# Patient Record
Sex: Male | Born: 2000 | Hispanic: Yes | Marital: Single | Smoking: Never smoker
Health system: Southern US, Community
[De-identification: ages and names within clinical notes are randomized; demographics above are authoritative.]

## PROBLEM LIST (undated history)

## (undated) DIAGNOSIS — L309 Dermatitis, unspecified: Secondary | ICD-10-CM

## (undated) DIAGNOSIS — G4730 Sleep apnea, unspecified: Secondary | ICD-10-CM

## (undated) DIAGNOSIS — J353 Hypertrophy of tonsils with hypertrophy of adenoids: Secondary | ICD-10-CM

## (undated) DIAGNOSIS — J45909 Unspecified asthma, uncomplicated: Secondary | ICD-10-CM

## (undated) HISTORY — DX: Unspecified asthma, uncomplicated: J45.909

## (undated) HISTORY — DX: Dermatitis, unspecified: L30.9

---
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
## 2020-09-29 DIAGNOSIS — J301 Allergic rhinitis due to pollen: Secondary | ICD-10-CM | POA: Diagnosis not present

## 2020-10-06 DIAGNOSIS — J301 Allergic rhinitis due to pollen: Secondary | ICD-10-CM | POA: Diagnosis not present

## 2020-10-20 DIAGNOSIS — J301 Allergic rhinitis due to pollen: Secondary | ICD-10-CM | POA: Diagnosis not present

## 2020-10-20 DIAGNOSIS — H109 Unspecified conjunctivitis: Secondary | ICD-10-CM | POA: Diagnosis not present

## 2020-10-21 DIAGNOSIS — J301 Allergic rhinitis due to pollen: Secondary | ICD-10-CM | POA: Diagnosis not present

## 2020-11-03 DIAGNOSIS — J301 Allergic rhinitis due to pollen: Secondary | ICD-10-CM | POA: Diagnosis not present

## 2020-12-08 DIAGNOSIS — J301 Allergic rhinitis due to pollen: Secondary | ICD-10-CM | POA: Diagnosis not present

## 2020-12-29 DIAGNOSIS — J301 Allergic rhinitis due to pollen: Secondary | ICD-10-CM | POA: Diagnosis not present

## 2020-12-30 DIAGNOSIS — H109 Unspecified conjunctivitis: Secondary | ICD-10-CM | POA: Diagnosis not present

## 2020-12-30 DIAGNOSIS — L0100 Impetigo, unspecified: Secondary | ICD-10-CM | POA: Diagnosis not present

## 2021-01-05 DIAGNOSIS — J301 Allergic rhinitis due to pollen: Secondary | ICD-10-CM | POA: Diagnosis not present

## 2021-01-11 DIAGNOSIS — J301 Allergic rhinitis due to pollen: Secondary | ICD-10-CM | POA: Diagnosis not present

## 2021-01-19 DIAGNOSIS — J301 Allergic rhinitis due to pollen: Secondary | ICD-10-CM | POA: Diagnosis not present

## 2021-01-26 DIAGNOSIS — J301 Allergic rhinitis due to pollen: Secondary | ICD-10-CM | POA: Diagnosis not present

## 2021-02-09 DIAGNOSIS — J301 Allergic rhinitis due to pollen: Secondary | ICD-10-CM | POA: Diagnosis not present

## 2021-02-16 DIAGNOSIS — J301 Allergic rhinitis due to pollen: Secondary | ICD-10-CM | POA: Diagnosis not present

## 2021-03-02 DIAGNOSIS — J301 Allergic rhinitis due to pollen: Secondary | ICD-10-CM | POA: Diagnosis not present

## 2021-03-09 DIAGNOSIS — J301 Allergic rhinitis due to pollen: Secondary | ICD-10-CM | POA: Diagnosis not present

## 2021-03-30 DIAGNOSIS — J301 Allergic rhinitis due to pollen: Secondary | ICD-10-CM | POA: Diagnosis not present

## 2021-03-31 DIAGNOSIS — J301 Allergic rhinitis due to pollen: Secondary | ICD-10-CM | POA: Diagnosis not present

## 2021-04-06 DIAGNOSIS — J301 Allergic rhinitis due to pollen: Secondary | ICD-10-CM | POA: Diagnosis not present

## 2021-04-20 DIAGNOSIS — J301 Allergic rhinitis due to pollen: Secondary | ICD-10-CM | POA: Diagnosis not present

## 2021-04-27 DIAGNOSIS — J301 Allergic rhinitis due to pollen: Secondary | ICD-10-CM | POA: Diagnosis not present

## 2021-05-11 DIAGNOSIS — J301 Allergic rhinitis due to pollen: Secondary | ICD-10-CM | POA: Diagnosis not present

## 2021-05-15 DIAGNOSIS — H1033 Unspecified acute conjunctivitis, bilateral: Secondary | ICD-10-CM | POA: Diagnosis not present

## 2021-05-15 DIAGNOSIS — L2089 Other atopic dermatitis: Secondary | ICD-10-CM | POA: Diagnosis not present

## 2021-05-18 DIAGNOSIS — J301 Allergic rhinitis due to pollen: Secondary | ICD-10-CM | POA: Diagnosis not present

## 2021-05-25 DIAGNOSIS — J301 Allergic rhinitis due to pollen: Secondary | ICD-10-CM | POA: Diagnosis not present

## 2021-06-15 DIAGNOSIS — J301 Allergic rhinitis due to pollen: Secondary | ICD-10-CM | POA: Diagnosis not present

## 2021-06-22 DIAGNOSIS — J301 Allergic rhinitis due to pollen: Secondary | ICD-10-CM | POA: Diagnosis not present

## 2021-06-23 DIAGNOSIS — J301 Allergic rhinitis due to pollen: Secondary | ICD-10-CM | POA: Diagnosis not present

## 2021-06-29 DIAGNOSIS — J301 Allergic rhinitis due to pollen: Secondary | ICD-10-CM | POA: Diagnosis not present

## 2021-06-30 DIAGNOSIS — Z20822 Contact with and (suspected) exposure to covid-19: Secondary | ICD-10-CM | POA: Diagnosis not present

## 2021-06-30 DIAGNOSIS — Z03818 Encounter for observation for suspected exposure to other biological agents ruled out: Secondary | ICD-10-CM | POA: Diagnosis not present

## 2021-07-06 DIAGNOSIS — J301 Allergic rhinitis due to pollen: Secondary | ICD-10-CM | POA: Diagnosis not present

## 2021-07-27 DIAGNOSIS — J301 Allergic rhinitis due to pollen: Secondary | ICD-10-CM | POA: Diagnosis not present

## 2021-08-17 DIAGNOSIS — J301 Allergic rhinitis due to pollen: Secondary | ICD-10-CM | POA: Diagnosis not present

## 2021-09-11 DIAGNOSIS — S76312A Strain of muscle, fascia and tendon of the posterior muscle group at thigh level, left thigh, initial encounter: Secondary | ICD-10-CM | POA: Diagnosis not present

## 2021-09-14 DIAGNOSIS — J301 Allergic rhinitis due to pollen: Secondary | ICD-10-CM | POA: Diagnosis not present

## 2021-09-15 DIAGNOSIS — J301 Allergic rhinitis due to pollen: Secondary | ICD-10-CM | POA: Diagnosis not present

## 2021-09-28 DIAGNOSIS — J301 Allergic rhinitis due to pollen: Secondary | ICD-10-CM | POA: Diagnosis not present

## 2021-10-26 DIAGNOSIS — J301 Allergic rhinitis due to pollen: Secondary | ICD-10-CM | POA: Diagnosis not present

## 2021-11-09 DIAGNOSIS — J301 Allergic rhinitis due to pollen: Secondary | ICD-10-CM | POA: Diagnosis not present

## 2021-11-24 DIAGNOSIS — J101 Influenza due to other identified influenza virus with other respiratory manifestations: Secondary | ICD-10-CM | POA: Diagnosis not present

## 2021-11-24 DIAGNOSIS — J029 Acute pharyngitis, unspecified: Secondary | ICD-10-CM | POA: Diagnosis not present

## 2021-11-24 DIAGNOSIS — J209 Acute bronchitis, unspecified: Secondary | ICD-10-CM | POA: Diagnosis not present

## 2021-11-24 DIAGNOSIS — J019 Acute sinusitis, unspecified: Secondary | ICD-10-CM | POA: Diagnosis not present

## 2021-11-24 DIAGNOSIS — H109 Unspecified conjunctivitis: Secondary | ICD-10-CM | POA: Diagnosis not present

## 2021-11-24 DIAGNOSIS — Z03818 Encounter for observation for suspected exposure to other biological agents ruled out: Secondary | ICD-10-CM | POA: Diagnosis not present

## 2021-12-05 DIAGNOSIS — J301 Allergic rhinitis due to pollen: Secondary | ICD-10-CM | POA: Diagnosis not present

## 2021-12-07 DIAGNOSIS — J301 Allergic rhinitis due to pollen: Secondary | ICD-10-CM | POA: Diagnosis not present

## 2021-12-21 DIAGNOSIS — J301 Allergic rhinitis due to pollen: Secondary | ICD-10-CM | POA: Diagnosis not present

## 2022-01-18 DIAGNOSIS — J301 Allergic rhinitis due to pollen: Secondary | ICD-10-CM | POA: Diagnosis not present

## 2022-01-30 DIAGNOSIS — J45901 Unspecified asthma with (acute) exacerbation: Secondary | ICD-10-CM | POA: Diagnosis not present

## 2022-02-15 DIAGNOSIS — J301 Allergic rhinitis due to pollen: Secondary | ICD-10-CM | POA: Diagnosis not present

## 2022-02-21 ENCOUNTER — Ambulatory Visit: Payer: Self-pay | Admitting: Internal Medicine

## 2022-03-01 ENCOUNTER — Encounter: Payer: Self-pay | Admitting: Internal Medicine

## 2022-03-01 ENCOUNTER — Ambulatory Visit (INDEPENDENT_AMBULATORY_CARE_PROVIDER_SITE_OTHER): Payer: BLUE CROSS/BLUE SHIELD | Admitting: Internal Medicine

## 2022-03-01 ENCOUNTER — Other Ambulatory Visit: Payer: Self-pay

## 2022-03-01 VITALS — BP 125/74 | HR 87 | Temp 97.9°F | Ht 71.85 in | Wt 251.0 lb

## 2022-03-01 DIAGNOSIS — M5442 Lumbago with sciatica, left side: Secondary | ICD-10-CM | POA: Diagnosis not present

## 2022-03-01 DIAGNOSIS — J4520 Mild intermittent asthma, uncomplicated: Secondary | ICD-10-CM | POA: Diagnosis not present

## 2022-03-01 DIAGNOSIS — J301 Allergic rhinitis due to pollen: Secondary | ICD-10-CM | POA: Diagnosis not present

## 2022-03-01 LAB — URINALYSIS, ROUTINE W REFLEX MICROSCOPIC
Bilirubin, UA: NEGATIVE
Glucose, UA: NEGATIVE
Ketones, UA: NEGATIVE
Leukocytes,UA: NEGATIVE
Nitrite, UA: NEGATIVE
Protein,UA: NEGATIVE
RBC, UA: NEGATIVE
Specific Gravity, UA: 1.02 (ref 1.005–1.030)
Urobilinogen, Ur: 0.2 mg/dL (ref 0.2–1.0)
pH, UA: 5.5 (ref 5.0–7.5)

## 2022-03-01 MED ORDER — FLUTICASONE-SALMETEROL 100-50 MCG/ACT IN AEPB: 1.0000 | INHALATION_SPRAY | Freq: Two times a day (BID) | RESPIRATORY_TRACT | 3 refills | Status: DC

## 2022-03-01 NOTE — Progress Notes (Signed)
? ?BP 125/74   Pulse 87   Temp 97.9 ?F (36.6 ?C) (Oral)   Ht 5' 11.85" (1.825 m)   Wt 251 lb (113.9 kg)   SpO2 99%   BMI 34.18 kg/m?   ? ?Subjective:  ? ? Patient ID: Curtis Meyer, male    DOB: 2001-03-15, 21 y.o.   MRN: [id number] ? ?Chief Complaint  ?Patient presents with  ?? New Patient (Initial Visit)  ?  Left Leg pain   ? ? ?HPI: ?Curtis Meyer is a 21 y.o. male ? ?Pt is here to establish care ?He says he feels like he pinched a nerve in the lower back at work. Has some pain on the left side that radiates down to the LLE ?Used to lift boxes for [employer], worse when he plays soccer too  ? ? ?Back Pain ?This is a chronic problem. The current episode started 1 to 4 weeks ago. The problem occurs intermittently. The pain is present in the lumbar spine. The quality of the pain is described as shooting. The pain radiates to the left foot. The pain is at a severity of 8/10. The pain is moderate. Associated symptoms include numbness and paresthesias. Pertinent negatives include no chest pain, fever, headaches, leg pain, pelvic pain, perianal numbness, tingling, weakness or weight loss.  ?Asthma ?He complains of cough and wheezing. There is no chest tightness, difficulty breathing, frequent throat clearing, hemoptysis, hoarse voice, shortness of breath or sputum production. Primary symptoms comments: Seasonal and when he is sick  ?Marland Kitchen Pertinent negatives include no chest pain, dyspnea on exertion, ear congestion, fever, headaches, heartburn, malaise/fatigue, myalgias, nasal congestion, orthopnea, PND, postnasal drip, rhinorrhea, sneezing, sore throat, sweats, trouble swallowing or weight loss. His symptoms are aggravated by pollen, URI, change in weather and strenuous activity. His past medical history is significant for asthma.  ? ?Chief Complaint  ?Patient presents with  ?? New Patient (Initial Visit)  ?  Left Leg pain   ? ? ?Relevant past medical, surgical, family and social history reviewed and updated as  indicated. Interim medical history since our last visit reviewed. ?Allergies and medications reviewed and updated. ? ?Review of Systems  ?Constitutional:  Negative for fever, malaise/fatigue and weight loss.  ?HENT:  Negative for hoarse voice, postnasal drip, rhinorrhea, sneezing, sore throat and trouble swallowing.   ?Respiratory:  Positive for cough and wheezing. Negative for hemoptysis, sputum production and shortness of breath.   ?Cardiovascular:  Negative for chest pain, dyspnea on exertion and PND.  ?Gastrointestinal:  Negative for heartburn.  ?Genitourinary:  Negative for pelvic pain.  ?Musculoskeletal:  Positive for back pain. Negative for myalgias.  ?Neurological:  Positive for numbness and paresthesias. Negative for tingling, weakness and headaches.  ? ?Per HPI unless specifically indicated above ? ?   ?Objective:  ?  ?BP 125/74   Pulse 87   Temp 97.9 ?F (36.6 ?C) (Oral)   Ht 5' 11.85" (1.825 m)   Wt 251 lb (113.9 kg)   SpO2 99%   BMI 34.18 kg/m?   ?Wt Readings from Last 3 Encounters:  ?03/01/22 251 lb (113.9 kg)  ?11/16/15 202 lb (91.6 kg) (>99 %, Z= 2.43)*  ? ?* Growth percentiles are based on CDC (Boys, 2-20 Years) data.  ?  ?Physical Exam ?Vitals and nursing note reviewed.  ?Constitutional:   ?   General: He is not in acute distress. ?   Appearance: Normal appearance. He is not ill-appearing or diaphoretic.  ?HENT:  ?   Head: Normocephalic  and atraumatic.  ?   Right Ear: Tympanic membrane and external ear normal. There is no impacted cerumen.  ?   Left Ear: External ear normal.  ?   Nose: No congestion or rhinorrhea.  ?   Mouth/Throat:  ?   Pharynx: No oropharyngeal exudate or posterior oropharyngeal erythema.  ?Eyes:  ?   Conjunctiva/sclera: Conjunctivae normal.  ?   Pupils: Pupils are equal, round, and reactive to light.  ?Cardiovascular:  ?   Rate and Rhythm: Normal rate and regular rhythm.  ?   Heart sounds: No murmur heard. ?  No friction rub. No gallop.  ?Pulmonary:  ?   Effort: No  respiratory distress.  ?   Breath sounds: No stridor. No wheezing or rhonchi.  ?Chest:  ?   Chest wall: No tenderness.  ?Abdominal:  ?   General: Abdomen is flat. Bowel sounds are normal.  ?   Palpations: Abdomen is soft. There is no mass.  ?   Tenderness: There is no abdominal tenderness.  ?Musculoskeletal:     ?   General: Tenderness present. No swelling, deformity or signs of injury.  ?   Cervical back: Normal range of motion and neck supple. No rigidity or tenderness.  ?   Right lower leg: No edema.  ?   Left lower leg: No edema.  ?Skin: ?   General: Skin is warm and dry.  ?Neurological:  ?   Mental Status: He is alert.  ?Psychiatric:     ?   Mood and Affect: Mood normal.     ?   Behavior: Behavior normal.  ? ? ?Results for orders placed or performed during the hospital encounter of 11/16/15  ?Surgical pathology  ?Result Value Ref Range  ? SURGICAL PATHOLOGY    ?  Surgical Pathology ?CASE: [id number] ?PATIENT: Curtis Meyer ?Surgical Pathology Report ? ? ? ? ?SPECIMEN SUBMITTED: ?A. Tonsil, left ?B. Tonsil, right ? ?CLINICAL HISTORY: ?None provided ? ?PRE-OPERATIVE DIAGNOSIS: ?T and A hypertrophy snoring SOB ? ?POST-OPERATIVE DIAGNOSIS: ?Same as pre-op ? ? ? ? ?DIAGNOSIS: ?A. TONSIL, LEFT; TONSILLECTOMY: ?- REACTIVE LYMPHOID HYPERPLASIA WITH ACUTE CRYPTITIS. ?- NEGATIVE FOR MALIGNANCY. ? ?B. TONSIL, RIGHT; TONSILLECTOMY: ?- REACTIVE LYMPHOID HYPERPLASIA WITH ACUTE CRYPTITIS. ?- NEGATIVE FOR MALIGNANCY. ? ? ?GROSS DESCRIPTION: ?A. Labeled: Left tonsil ? ?Size: 4.2 x 2 x 1.2 cm ? ?Epithelial surface: Pink red and cryptic ? ?Cut surface: Unremarkable ? ?Other findings: None ? ?Block summary: ?1- representative section(s) ? ?B. Labeled: Right tonsil ? ?Size: 4.2 x 2 x 1.2 cm ? ?Epithelial surface: Pink red and soft ? ?Cut surface: Unremarkable ? ?Other findings: None ? ?Block summary: ?1- representative section(s) ? ? ? ? ?Final Diagnosis perform ed by Quay Burow, MD.  Electronically signed ?11/21/2015  9:52:21AM ? ? ? ?The electronic signature indicates that the named Attending Pathologist ?has evaluated the specimen ? ?Technical component performed at [facility], [address] [place], ?[place] [address] ?Lab: [id number] Dir: Darrick Penna. Evette Doffing, MD ? ?Professional component performed at [facility], [facility], ?[place], [place], [place] [address] ?Lab: [phone] Dir: Dellia Nims. Reuel Derby, MD ? ?  ? ?   ? ? ?Current Outpatient Medications:  ??  albuterol (PROVENTIL HFA;VENTOLIN HFA) 108 (90 BASE) MCG/ACT inhaler, Inhale 2 puffs into the lungs every 6 (six) hours as needed for wheezing or shortness of breath., Disp: 1 Inhaler, Rfl: 2 ??  cetirizine (ZYRTEC) 5 MG tablet, Take 5 mg by mouth daily., Disp: , Rfl:  ??  fluticasone (  FLONASE) 50 MCG/ACT nasal spray, Place into both nostrils daily., Disp: , Rfl:   ? ? ?Assessment & Plan:  ?Back pain  ?Check xray of the l spine.  ?most likely musckulskelteal though ?adviced streches for back ?Patient advised to take medication as directed here. Patient advised to rest initially and then slowly increase activity level. Monitor changes in symptoms such as numbness, tingling or weakness in legs, changes in bowel or bladder habits or worsening back pain. Proper ergonomics discussed. Referral to physical therapy as needed. Patient will call if symptoms worsen or if pain persists greater than 8 weeks. ? ? ?Asthma  ?Stable. Is on advair for such  ?Continue albuterol for such  ? ?Problem List Items Addressed This Visit   ?None ?  ? ?No orders of the defined types were placed in this encounter. ?  ? ?No orders of the defined types were placed in this encounter. ?  ? ?Follow up plan: ?No follow-ups on file. ? ? ?

## 2022-03-02 LAB — COMPREHENSIVE METABOLIC PANEL
ALT: 48 IU/L — ABNORMAL HIGH (ref 0–44)
AST: 32 IU/L (ref 0–40)
Albumin/Globulin Ratio: 1.7 (ref 1.2–2.2)
Albumin: 5.1 g/dL (ref 4.1–5.2)
Alkaline Phosphatase: 118 IU/L (ref 51–125)
BUN/Creatinine Ratio: 10 (ref 9–20)
BUN: 8 mg/dL (ref 6–20)
Bilirubin Total: 0.7 mg/dL (ref 0.0–1.2)
CO2: 19 mmol/L — ABNORMAL LOW (ref 20–29)
Calcium: 10.2 mg/dL (ref 8.7–10.2)
Chloride: 102 mmol/L (ref 96–106)
Creatinine, Ser: 0.83 mg/dL (ref 0.76–1.27)
Globulin, Total: 3 g/dL (ref 1.5–4.5)
Glucose: 86 mg/dL (ref 70–99)
Potassium: 4 mmol/L (ref 3.5–5.2)
Sodium: 139 mmol/L (ref 134–144)
Total Protein: 8.1 g/dL (ref 6.0–8.5)
eGFR: 128 mL/min/{1.73_m2} (ref >59)

## 2022-03-02 LAB — CBC WITH DIFFERENTIAL/PLATELET
Basophils Absolute: 0.1 10*3/uL (ref 0.0–0.2)
Basos: 1 %
EOS (ABSOLUTE): 0.5 10*3/uL — ABNORMAL HIGH (ref 0.0–0.4)
Eos: 7 %
Hematocrit: 47.6 % (ref 37.5–51.0)
Hemoglobin: 16.1 g/dL (ref 13.0–17.7)
Immature Grans (Abs): 0 10*3/uL (ref 0.0–0.1)
Immature Granulocytes: 1 %
Lymphocytes Absolute: 2.2 10*3/uL (ref 0.7–3.1)
Lymphs: 34 %
MCH: 27.5 pg (ref 26.6–33.0)
MCHC: 33.8 g/dL (ref 31.5–35.7)
MCV: 81 fL (ref 79–97)
Monocytes Absolute: 0.5 10*3/uL (ref 0.1–0.9)
Monocytes: 7 %
Neutrophils Absolute: 3.4 10*3/uL (ref 1.4–7.0)
Neutrophils: 50 %
Platelets: 189 10*3/uL (ref 150–450)
RBC: 5.86 x10E6/uL — ABNORMAL HIGH (ref 4.14–5.80)
RDW: 14 % (ref 11.6–15.4)
WBC: 6.6 10*3/uL (ref 3.4–10.8)

## 2022-03-02 LAB — TSH: TSH: 1.61 u[IU]/mL (ref 0.450–4.500)

## 2022-03-15 ENCOUNTER — Ambulatory Visit: Payer: BC Managed Care – PPO | Admitting: Internal Medicine

## 2022-03-28 ENCOUNTER — Ambulatory Visit: Payer: BC Managed Care – PPO | Admitting: Internal Medicine

## 2022-03-29 ENCOUNTER — Ambulatory Visit: Payer: BC Managed Care – PPO | Admitting: Internal Medicine

## 2022-04-05 DIAGNOSIS — J301 Allergic rhinitis due to pollen: Secondary | ICD-10-CM | POA: Diagnosis not present

## 2022-04-11 ENCOUNTER — Encounter: Payer: Self-pay | Admitting: Internal Medicine

## 2022-04-11 ENCOUNTER — Ambulatory Visit (INDEPENDENT_AMBULATORY_CARE_PROVIDER_SITE_OTHER): Payer: BLUE CROSS/BLUE SHIELD | Admitting: Internal Medicine

## 2022-04-11 VITALS — BP 124/76 | HR 83 | Temp 97.6°F | Ht 71.85 in | Wt 245.0 lb

## 2022-04-11 DIAGNOSIS — M545 Low back pain, unspecified: Secondary | ICD-10-CM | POA: Diagnosis not present

## 2022-04-11 DIAGNOSIS — G8929 Other chronic pain: Secondary | ICD-10-CM | POA: Insufficient documentation

## 2022-04-11 DIAGNOSIS — M79605 Pain in left leg: Secondary | ICD-10-CM | POA: Insufficient documentation

## 2022-04-11 DIAGNOSIS — S76302A Unspecified injury of muscle, fascia and tendon of the posterior muscle group at thigh level, left thigh, initial encounter: Secondary | ICD-10-CM | POA: Diagnosis not present

## 2022-04-11 MED ORDER — DICLOFENAC SODIUM 1 % EX GEL: 2.0000 g | Freq: Four times a day (QID) | CUTANEOUS | 1 refills | Status: DC

## 2022-04-11 NOTE — Progress Notes (Signed)
? ?BP 124/76   Pulse 83   Temp 97.6 ?F (36.4 ?C) (Oral)   Ht 5' 11.85" (1.825 m)   Wt 245 lb (111.1 kg)   SpO2 98%   BMI 33.37 kg/m?   ? ?Subjective:  ? ? Patient ID: Curtis Meyer, male    DOB: March 25, 2001, 21 y.o.   MRN: [id number] ? ?Chief Complaint  ?Patient presents with  ?? Back Pain  ?  Back pain has not changed, patient did not get xray  ? ? ?HPI: ?Curtis Meyer is a 21 y.o. male ? ?Back Pain ?Chronicity: has had back injury in the past from lifting a box at work x 2 yrs ago. pain is in the hamstrings.  ? ?Chief Complaint  ?Patient presents with  ?? Back Pain  ?  Back pain has not changed, patient did not get xray  ? ? ?Relevant past medical, surgical, family and social history reviewed and updated as indicated. Interim medical history since our last visit reviewed. ?Allergies and medications reviewed and updated. ? ?Review of Systems  ?Musculoskeletal:  Positive for back pain.  ? ?Per HPI unless specifically indicated above ? ?   ?Objective:  ?  ?BP 124/76   Pulse 83   Temp 97.6 ?F (36.4 ?C) (Oral)   Ht 5' 11.85" (1.825 m)   Wt 245 lb (111.1 kg)   SpO2 98%   BMI 33.37 kg/m?   ?Wt Readings from Last 3 Encounters:  ?04/11/22 245 lb (111.1 kg)  ?03/01/22 251 lb (113.9 kg)  ?11/16/15 202 lb (91.6 kg) (>99 %, Z= 2.43)*  ? ?* Growth percentiles are based on CDC (Boys, 2-20 Years) data.  ?  ?Physical Exam ?Vitals and nursing note reviewed.  ?Constitutional:   ?   General: He is not in acute distress. ?   Appearance: Normal appearance. He is not ill-appearing or diaphoretic.  ?HENT:  ?   Head: Normocephalic and atraumatic.  ?   Right Ear: Tympanic membrane and external ear normal. There is no impacted cerumen.  ?   Left Ear: External ear normal.  ?   Nose: No congestion or rhinorrhea.  ?   Mouth/Throat:  ?   Pharynx: No oropharyngeal exudate or posterior oropharyngeal erythema.  ?Eyes:  ?   Conjunctiva/sclera: Conjunctivae normal.  ?   Pupils: Pupils are equal, round, and reactive to light.   ?Cardiovascular:  ?   Rate and Rhythm: Normal rate and regular rhythm.  ?   Heart sounds: No murmur heard. ?  No friction rub. No gallop.  ?Pulmonary:  ?   Effort: No respiratory distress.  ?   Breath sounds: No stridor. No wheezing or rhonchi.  ?Chest:  ?   Chest wall: No tenderness.  ?Abdominal:  ?   General: Abdomen is flat. Bowel sounds are normal.  ?   Palpations: Abdomen is soft. There is no mass.  ?   Tenderness: There is no abdominal tenderness.  ?Musculoskeletal:  ?   Cervical back: Normal range of motion and neck supple. No rigidity or tenderness.  ?   Left lower leg: No edema.  ?Skin: ?   General: Skin is warm and dry.  ?Neurological:  ?   Mental Status: He is alert.  ? ? ?Results for orders placed or performed in visit on 03/01/22  ?CBC with Differential/Platelet  ?Result Value Ref Range  ? WBC 6.6 3.4 - 10.8 x10E3/uL  ? RBC 5.86 (H) 4.14 - 5.80 x10E6/uL  ? Hemoglobin 16.1 13.0 -  17.7 g/dL  ? Hematocrit 47.6 37.5 - 51.0 %  ? MCV 81 79 - 97 fL  ? MCH 27.5 26.6 - 33.0 pg  ? MCHC 33.8 31.5 - 35.7 g/dL  ? RDW 14.0 11.6 - 15.4 %  ? Platelets 189 150 - 450 x10E3/uL  ? Neutrophils 50 Not Estab. %  ? Lymphs 34 Not Estab. %  ? Monocytes 7 Not Estab. %  ? Eos 7 Not Estab. %  ? Basos 1 Not Estab. %  ? Neutrophils Absolute 3.4 1.4 - 7.0 x10E3/uL  ? Lymphocytes Absolute 2.2 0.7 - 3.1 x10E3/uL  ? Monocytes Absolute 0.5 0.1 - 0.9 x10E3/uL  ? EOS (ABSOLUTE) 0.5 (H) 0.0 - 0.4 x10E3/uL  ? Basophils Absolute 0.1 0.0 - 0.2 x10E3/uL  ? Immature Granulocytes 1 Not Estab. %  ? Immature Grans (Abs) 0.0 0.0 - 0.1 x10E3/uL  ?Comprehensive metabolic panel  ?Result Value Ref Range  ? Glucose 86 70 - 99 mg/dL  ? BUN 8 6 - 20 mg/dL  ? Creatinine, Ser 0.83 0.76 - 1.27 mg/dL  ? eGFR 128 >59 mL/min/1.73  ? BUN/Creatinine Ratio 10 9 - 20  ? Sodium 139 134 - 144 mmol/L  ? Potassium 4.0 3.5 - 5.2 mmol/L  ? Chloride 102 96 - 106 mmol/L  ? CO2 19 (L) 20 - 29 mmol/L  ? Calcium 10.2 8.7 - 10.2 mg/dL  ? Total Protein 8.1 6.0 - 8.5 g/dL  ?  Albumin 5.1 4.1 - 5.2 g/dL  ? Globulin, Total 3.0 1.5 - 4.5 g/dL  ? Albumin/Globulin Ratio 1.7 1.2 - 2.2  ? Bilirubin Total 0.7 0.0 - 1.2 mg/dL  ? Alkaline Phosphatase 118 51 - 125 IU/L  ? AST 32 0 - 40 IU/L  ? ALT 48 (H) 0 - 44 IU/L  ?Urinalysis, Routine w reflex microscopic  ?Result Value Ref Range  ? Specific Gravity, UA 1.020 1.005 - 1.030  ? pH, UA 5.5 5.0 - 7.5  ? Color, UA Yellow Yellow  ? Appearance Ur Clear Clear  ? Leukocytes,UA Negative Negative  ? Protein,UA Negative Negative/Trace  ? Glucose, UA Negative Negative  ? Ketones, UA Negative Negative  ? RBC, UA Negative Negative  ? Bilirubin, UA Negative Negative  ? Urobilinogen, Ur 0.2 0.2 - 1.0 mg/dL  ? Nitrite, UA Negative Negative  ?TSH  ?Result Value Ref Range  ? TSH 1.610 0.450 - 4.500 uIU/mL  ? ?   ? ? ?Current Outpatient Medications:  ??  albuterol (PROVENTIL HFA;VENTOLIN HFA) 108 (90 BASE) MCG/ACT inhaler, Inhale 2 puffs into the lungs every 6 (six) hours as needed for wheezing or shortness of breath., Disp: 1 Inhaler, Rfl: 2 ??  cetirizine (ZYRTEC) 5 MG tablet, Take 5 mg by mouth daily., Disp: , Rfl:  ??  diclofenac Sodium (VOLTAREN) 1 % GEL, Apply 2 g topically 4 (four) times daily., Disp: 2 g, Rfl: 1 ??  fluticasone (FLONASE) 50 MCG/ACT nasal spray, Place into both nostrils daily., Disp: , Rfl:  ??  fluticasone-salmeterol (ADVAIR) 100-50 MCG/ACT AEPB, Inhale 1 puff into the lungs 2 (two) times daily., Disp: 1 each, Rfl: 3  ? ? ?Assessment & Plan:  ?Back pain encouraged getting L spine xray as scheduled.  ?Pt agrees to such  ?Will need to continue back exercises  ?Sitting down to get up worsens the pain  ?Will send pt to PT.  ? ? ?Problem List Items Addressed This Visit   ? ?  ? Musculoskeletal and Integument  ? Left hamstring injury  ?  ?  Other  ? Chronic low back pain - Primary  ? Relevant Orders  ? DG Lumbar Spine Complete  ? Ambulatory referral to Physical Therapy  ? Pain of left lower extremity  ? Relevant Orders  ? Ambulatory referral to  Physical Therapy  ?  ? ?Orders Placed This Encounter  ?Procedures  ?? DG Lumbar Spine Complete  ?? Ambulatory referral to Physical Therapy  ?  ? ?Meds ordered this encounter  ?Medications  ?? diclofenac Sodium (VOLTAREN) 1 % GEL  ?  Sig: Apply 2 g topically 4 (four) times daily.  ?  Dispense:  2 g  ?  Refill:  1  ?  ? ?Follow up plan: ?Return in about 1 year (around 04/12/2023). ? ? ?

## 2022-04-23 ENCOUNTER — Ambulatory Visit: Payer: BLUE CROSS/BLUE SHIELD | Attending: Internal Medicine

## 2022-04-23 DIAGNOSIS — M5432 Sciatica, left side: Secondary | ICD-10-CM

## 2022-04-23 DIAGNOSIS — M5417 Radiculopathy, lumbosacral region: Secondary | ICD-10-CM | POA: Diagnosis present

## 2022-04-23 DIAGNOSIS — M545 Low back pain, unspecified: Secondary | ICD-10-CM | POA: Diagnosis not present

## 2022-04-23 DIAGNOSIS — M79605 Pain in left leg: Secondary | ICD-10-CM | POA: Insufficient documentation

## 2022-04-23 DIAGNOSIS — G8929 Other chronic pain: Secondary | ICD-10-CM | POA: Insufficient documentation

## 2022-04-23 DIAGNOSIS — M5459 Other low back pain: Secondary | ICD-10-CM

## 2022-04-23 NOTE — Therapy (Signed)
[place] ?[facility] ?[address] ?[place], [place], [address] ?Phone: [phone]   Fax:  [phone] ? ?Physical Therapy Evaluation ? ?Patient Details  ?Name: Curtis Meyer ?MRN: [id number] ?Date of Birth: July 26, 2001 ?Referring Provider (PT): Loura Pardon, MD ? ? ?Encounter Date: 04/23/2022 ? ? PT End of Session - 04/23/22 1543   ? ? Visit Number 1   ? Number of Visits 17   ? Date for PT Re-Evaluation 06/21/22   ? Authorization Type 1   ? Authorization Time Period 10   ? PT Start Time 1545   ? PT Stop Time 1639   ? PT Time Calculation (min) 54 min   ? Activity Tolerance Patient tolerated treatment well   ? Behavior During Therapy [facility] for tasks assessed/performed   ? ?  ?  ? ?  ? ? ?Past Medical History:  ?Diagnosis Date  ? Asthma   ? Eczema   ? Sleep-disordered breathing   ? Tonsillar and adenoid hypertrophy   ? SNORING  ? ? ?Past Surgical History:  ?Procedure Laterality Date  ? TONSILLECTOMY AND ADENOIDECTOMY N/A 11/16/2015  ? Procedure: TONSILLECTOMY AND ADENOIDECTOMY;  Surgeon: Bud Face, MD;  Location: [facility];  Service: ENT;  Laterality: N/A;  ADENOIDS CAUTERIZED NO TISSUE SENT  ? ? ?There were no vitals filed for this visit. ? ? ? Subjective Assessment - 04/23/22 1548   ? ? Subjective Low back: 4/10, R low back pain currently (pt sitting); 7/10 R low back pain at worst for the past 3 months (no L low back  pain for the past 3 months but had L low back pain 1.5 years ago). L hamstrings/posterior thigh: 0/10 currently (pt sitting), 8/10 at worst for the past 3 months.   ? Pertinent History Low back pain, L LE pain. Pain began about 1.5 years ago. Pt was working at [employer]. Lifted a mattress and felt pain in low back. Pain has improved since onset. Now his hamstrings hurt after playing soccer or running. Pain used to go down his leg (sciatic distribution), currently goes down behind his thigh not past the knee. L hamstrings pain begain about  2 months after back injury, unknown mechanism of onset. No L hamstring swelling or back. No loss of bowel or bladder control. No LE paresthesia. Pt was at work this past Fri at [employer], liftted crate up, bent down and R low back started bothering him like 1.5 years ago but not as bad. No R LE paresthesia.   ? Patient Stated Goals Exercise with less pain.   ? Currently in Pain? Yes   ? Pain Score 4    ? Pain Location Back   ? Pain Orientation Lower   ? Pain Descriptors / Indicators Sharp;Sore   Low back soreness, L hamstrings sharpness  ? Pain Type Chronic pain   ? Pain Radiating Towards L posterior thigh/sciatic nerve   ? Pain Onset More than a month ago   ? Pain Frequency Occasional   ? Aggravating Factors  Sitting for long period (about 30 minutes), bending over, squatting position, playing soccer, running. Walking   ? Pain Relieving Factors rest, standing up makes L hamstrings feel better.   ? ?  ?  ? ?  ? ? ? ? ? [facility] PT Assessment - 04/23/22 1603   ? ?  ? Assessment  ? Medical Diagnosis M54.50,G89.29 (ICD-10-CM) - Chronic low back pain, unspecified back pain laterality, unspecified whether sciatica present  M79.605 (ICD-10-CM) - Pain of left lower extremity   ? Referring Provider (PT) Loura Pardon, MD   ? Onset Date/Surgical Date 04/11/22   Date PT referral signed.  ? Hand Dominance Right   ? Prior Therapy No known PT for current condition   ?  ? Precautions  ? Precaution Comments No known precautions   ?  ? Restrictions  ? Other Position/Activity Restrictions No known restrictiions   ?  ? Prior Function  ? Vocation Full time employment   [employer]  ?  ? Observation/Other Assessments  ? Focus on Therapeutic Outcomes (FOTO)  Lumbar spine FOTO 25   ?  ? Posture/Postural Control  ? Posture Comments forward neck, bilaterally protracted shoulders, decreased lumbar lordosis, L lateral shift around L3/L4 area, L shoulder lower. R greater trochanter lower, R genu valgus. L foot pronation   ?  ? AROM  ? Lumbar Flexion limited  with L posterior thigh pain   ? Lumbar Extension WFL, no pain   ? Lumbar - Right Side [facility]   ? Lumbar - Left Side [facility] with R low back pain   ? Lumbar - Right Rotation WFL with R low back pain   ? Lumbar - Left Rotation WFL   ?  ? PROM  ? Right Hip Internal Rotation  -16   stiff end feel  ? Left Hip Internal Rotation  17   ?  ? Strength  ? Right Hip Flexion 4/5   ? Right Hip Extension 4/5   ? Right Hip ABduction 5/5   ? Left Hip Flexion 4/5   ? Left Hip Extension 4/5   ? Left Hip ABduction 5/5   ? Right Knee Flexion 5/5   ? Right Knee Extension 5/5   ? Left Knee Flexion 5/5   ? Left Knee Extension 5/5   with L posterior thigh pain  ?  ? Palpation  ? Palpation comment No TTP L hamstrings. TTP R L5, L4 transverse process, and CPA L5 spinous process.   ?  ? Special Tests  ? Other special tests Long sit test suggests anterior nutation L innominate.   (+) Slump test L LE with reproduction of symptoms.   ?  ? Ambulation/Gait  ? Gait Comments decreased stance R LE, B genu valgus   ? ?  ?  ? ?  ? ? ? ? ? ? ? ? ? ? ? ? ? ?Objective measurements completed on examination: See above findings.  ? ?No latex allergies ? ? ?Lifting 9.5 lb box: pt used hips and knees, back still curved, no pain.  ?Unknown mechanism of injury for L hamstring pain ?Eccentric L hamstring contraction in prone reproduced L hamstrings pain. No pain with L hamstrings concentric contraction.  ? ?Long sit test suggests anterior nutation L innominate.  ?(+) Slump test L LE with reproduction of symptoms.  ? ? ?Therapeutic exercise ? ?Standing L lateral shift correction 10x5 seconds ? ?Improved exercise technique, movement at target joints, use of target muscles after mod verbal, visual, tactile cues.  ? ? ? ?Response to treatment ?No back pain after session.  ? ? ?Clinical impression ?Pt is a 21 year old male who came to physical therapy secondary to low back and L posterior thigh pain. He also presents with altered gait pattern and posture,  reproduction of L hamstrings symptoms with lumbar flexion, and Slump testing (which also suggests sciatic nerve neural tension), reproduction of R low back pain with L lumbar side  bending as well as R lumbar rotation, positive special tests suggesting lumbopelvic involvement, bilateral glute max weakness, limited B hip IR ROM, and difficulty performing tasks which involve bending over as well as running due to pain. Pt will benefit from skilled physical therapy services to address the aforementioned deficits.  ? ? ? ? ? ? ? ? ?Medbridge Access Code: [email or web address] ?URL: [email or web address] ?Date: 04/23/2022 ?Prepared by: Loralyn FreshwaterMiguel Adri Schloss ? ?Exercises ?- Lateral Shift Correction at Wall  - 1 x daily - 7 x weekly - 3 sets - 10 reps - 5 seconds hold ? ? ? ?Extension preference.  ? ? ? ? ? ? ? ? ? ? ? ? ? ? ? PT Education - 04/23/22 1759   ? ? Education Details ther-ex, HEP, POC   ? Person(s) Educated Patient   ? Methods Explanation;Demonstration;Tactile cues;Verbal cues;Handout   ? Comprehension Verbalized understanding;Returned demonstration   ? ?  ?  ? ?  ? ? ? PT Short Term Goals - 04/23/22 1751   ? ?  ? PT SHORT TERM GOAL #1  ? Title Pt will be independent with his initial HEP to decrease pain, improve strength, function, and ability to perform work related tasks as well as run more comfortably.   ? Baseline Pt has started his initial HEP (04/23/2022)   ? Time 3   ? Period Weeks   ? Status New   ? Target Date 05/17/22   ? ?  ?  ? ?  ? ? ? ? PT Long Term Goals - 04/23/22 1752   ? ?  ? PT LONG TERM GOAL #1  ? Title Pt will have a decrease in low back pain to 3/10 or less at worst to promote ability to lift, sit, as well as run more comfortably.   ? Baseline 7/10 R low back pain at worst for the past 3 months (04/23/2022)   ? Time 8   ? Period Weeks   ? Status New   ? Target Date 06/21/22   ?  ? PT LONG TERM GOAL #2  ? Title Pt will have a decrease in L LE pain to 4/10 or less at worst to promote ability to lift,  sit, as well as run more comfortably.   ? Baseline 8/10 L hamstrings/posterior thigh pain at worst for the past 3 months (04/23/2022)   ? Time 8   ? Period Weeks   ? Status New   ? Target Date 06/21/22   ?  ? PT L

## 2022-04-26 DIAGNOSIS — J301 Allergic rhinitis due to pollen: Secondary | ICD-10-CM | POA: Diagnosis not present

## 2022-04-30 ENCOUNTER — Telehealth: Payer: Self-pay

## 2022-04-30 ENCOUNTER — Ambulatory Visit: Payer: BLUE CROSS/BLUE SHIELD

## 2022-04-30 NOTE — Telephone Encounter (Signed)
No show. Called patient and left a message pertaining to appointment and a reminder for the next follow up session. Return phone call requested. Phone number ([phone]) provided.   

## 2022-05-02 ENCOUNTER — Ambulatory Visit: Payer: BLUE CROSS/BLUE SHIELD

## 2022-05-02 DIAGNOSIS — M5432 Sciatica, left side: Secondary | ICD-10-CM

## 2022-05-02 DIAGNOSIS — M5417 Radiculopathy, lumbosacral region: Secondary | ICD-10-CM

## 2022-05-02 DIAGNOSIS — M5459 Other low back pain: Secondary | ICD-10-CM

## 2022-05-02 NOTE — Therapy (Signed)
?OUTPATIENT PHYSICAL THERAPY TREATMENT NOTE ? ? ?Patient Name: Curtis Meyer ?MRN: [id number] ?DOB:04-25-2001, 21 y.o., male ?Today's Date: 05/02/2022 ? ?PCP: Loura Pardon, MD ?REFERRING PROVIDER: Loura Pardon, MD ? ? PT End of Session - 05/02/22 0846   ? ? Visit Number 2   ? Number of Visits 17   ? Date for PT Re-Evaluation 06/21/22   ? Authorization Type 2   ? Authorization Time Period 10   ? PT Start Time [phone]   ? PT Stop Time 0926   ? PT Time Calculation (min) 40 min   ? Activity Tolerance Patient tolerated treatment well   ? Behavior During Therapy [facility] for tasks assessed/performed   ? ?  ?  ? ?  ? ? ?Past Medical History:  ?Diagnosis Date  ? Asthma   ? Eczema   ? Sleep-disordered breathing   ? Tonsillar and adenoid hypertrophy   ? SNORING  ? ?Past Surgical History:  ?Procedure Laterality Date  ? TONSILLECTOMY AND ADENOIDECTOMY N/A 11/16/2015  ? Procedure: TONSILLECTOMY AND ADENOIDECTOMY;  Surgeon: Bud Face, MD;  Location: [facility];  Service: ENT;  Laterality: N/A;  ADENOIDS CAUTERIZED NO TISSUE SENT  ? ?Patient Active Problem List  ? Diagnosis Date Noted  ? Chronic low back pain 04/11/2022  ? Left hamstring injury 04/11/2022  ? Pain of left lower extremity 04/11/2022  ? Acute left-sided low back pain with left-sided sciatica 03/01/2022  ? Mild intermittent asthma without complication 03/01/2022  ? Intrinsic atopic dermatitis 06/29/2016  ? ? ?REFERRING DIAG: M54.50,G89.29 (ICD-10-CM) - Chronic low back pain, unspecified back pain laterality, unspecified whether sciatica present  M79.605 (ICD-10-CM) - Pain of left lower extremity ? ?THERAPY DIAG:  ?Other low back pain ? ?Sciatica, left side ? ?Radiculopathy, lumbosacral region ? ?PERTINENT HISTORY: Low back pain, L LE pain. Pain began about 1.5 years ago. Pt was working at [employer]. Lifted a mattress and felt pain in low back. Pain has improved since onset. Now his hamstrings hurt after playing soccer or running. Pain used to go down his leg  (sciatic distribution), currently goes down behind his thigh not past the knee. L hamstrings pain begain about 2 months after back injury, unknown mechanism of onset. No L hamstring swelling or back. No loss of bowel or bladder control. No LE paresthesia. Pt was at work this past Fri at [employer], liftted crate up, bent down and R low back started bothering him like 1.5 years ago but not as bad. No R LE paresthesia. ? ?PRECAUTIONS: No known precautions ? ?SUBJECTIVE: Back  is doing good. L LE is the same. 0/10 low back, 4/10 L LE currently sitting and walking.  ? ?PAIN:  ?Are you having pain?  0/10 low back, 4/10 L LE currently sitting and walking. ? ? ? ?TODAY'S TREATMENT:  ?No latex allergies ?  ?  ?Lifting 9.5 lb box: pt used hips and knees, back still curved, no pain.  ?Unknown mechanism of injury for L hamstring pain ?Eccentric L hamstring contraction in prone reproduced L hamstrings pain. No pain with L hamstrings concentric contraction.  ?  ?Long sit test suggests anterior nutation L innominate.  ?(+) Slump test L LE with reproduction of symptoms.  ?  ?  ?Therapeutic exercise ?  ? ?Standing L lateral shift correction 10x5 seconds for 2 sets ? ?Supine hip IR stretch with PT  ? R 2 minutes x 2 ? ?Supine hip at 90/90, hip IR PROM ? R 10x3 ? ?Supine R piriformis stretch  1 minute x 3 ? ?Supine bridge 10x5 seconds for 3 sets ? ?Supine bridge with R LE march, emphasis on level pelvis to improve trunk and L glute max strength. 10x2 ? ?Prone on elbows 2 minutes x 2 ? ? ? ? ?  ?Improved exercise technique, movement at target joints, use of target muscles after mod verbal, visual, tactile cues.  ?  ?  ?  ?Response to treatment ?Pt tolerated session well without aggravation of symptoms.  ? ?  ?  ?Clinical impression ?Worked on improving R hip IR,  trunk and L glute strength as well as posture to decrease stress to low back and L sciatic nerve. No low back or L LE pain after session.  Pt will benefit from continued skilled  physical therapy services to decrease pain, improve strength and function. ? ? ? ? ? ? ?PATIENT EDUCATION: ?Education details: ther-ex ?Person educated: Patient ?Education method: Explanation, Demonstration, Tactile cues, and Verbal cues ?Education comprehension: verbalized understanding and returned demonstration ? ? ?HOME EXERCISE PROGRAM: ?Medbridge Access Code: [email or web address] ?URL: [email or web address] ?Date: 04/23/2022 ?Prepared by: Loralyn Freshwater ?  ?Exercises ?- Lateral Shift Correction at Wall  - 1 x daily - 7 x weekly - 3 sets - 10 reps - 5 seconds hold ? - Supine Piriformis Stretch  - 1 x daily - 7 x weekly - 3 sets - 10 reps - 1 minute hold ?- Supine Bridge  - 1 x daily - 7 x weekly - 3 sets - 10 reps - 5 seconds hold ?- Static Prone on Elbows  - 3 x daily - 7 x weekly - 1 sets - 2 reps - 2 minutes hold ? ? ? ? PT Short Term Goals - 04/23/22 1751   ? ?  ? PT SHORT TERM GOAL #1  ? Title Pt will be independent with his initial HEP to decrease pain, improve strength, function, and ability to perform work related tasks as well as run more comfortably.   ? Baseline Pt has started his initial HEP (04/23/2022)   ? Time 3   ? Period Weeks   ? Status New   ? Target Date 05/17/22   ? ?  ?  ? ?  ? ? ? PT Long Term Goals - 04/23/22 1752   ? ?  ? PT LONG TERM GOAL #1  ? Title Pt will have a decrease in low back pain to 3/10 or less at worst to promote ability to lift, sit, as well as run more comfortably.   ? Baseline 7/10 R low back pain at worst for the past 3 months (04/23/2022)   ? Time 8   ? Period Weeks   ? Status New   ? Target Date 06/21/22   ?  ? PT LONG TERM GOAL #2  ? Title Pt will have a decrease in L LE pain to 4/10 or less at worst to promote ability to lift, sit, as well as run more comfortably.   ? Baseline 8/10 L hamstrings/posterior thigh pain at worst for the past 3 months (04/23/2022)   ? Time 8   ? Period Weeks   ? Status New   ? Target Date 06/21/22   ?  ? PT LONG TERM GOAL #3  ? Title Pt will  improve B hip IR by at least 15 degrees to promote ability to ambulate, run, lift with less back pain.   ? Baseline Hip PROM -16 degrees R, 17 degrees L (04/23/2022)   ?  Time 8   ? Period Weeks   ? Status New   ? Target Date 06/21/22   ?  ? PT LONG TERM GOAL #4  ? Title Pt will improve his lumbar FOTO score by at least 10 points as a demonstration of improved function.   ? Baseline Lumbar spine FOTO: 25 (04/23/2022)   ? Time 8   ? Period Weeks   ? Status New   ? Target Date 06/21/22   ? ?  ?  ? ?  ? ? ? Plan - 05/02/22 0843   ? ? Clinical Impression Statement Worked on improving R hip IR,  trunk and L glute strength as well as posture to decrease stress to low back and L sciatic nerve. No low back or L LE pain after session.  Pt will benefit from continued skilled physical therapy services to decrease pain, improve strength and function.   ? Personal Factors and Comorbidities Time since onset of injury/illness/exacerbation;Profession   ? Examination-Activity Limitations Bend;Lift   ? Stability/Clinical Decision Making Stable/Uncomplicated   ? Rehab Potential Fair   ? PT Frequency 2x / week   ? PT Duration 8 weeks   ? PT Treatment/Interventions Therapeutic exercise;Therapeutic activities;Neuromuscular re-education;Patient/family education;Manual techniques;Dry needling;Spinal Manipulations;Joint Manipulations;Electrical Stimulation;Iontophoresis 4mg /ml Dexamethasone;Traction   ? PT Next Visit Plan posture, hip IR ROM, trunk and hip strengthening, manual techniques, modalities PRN   ? PT Home Exercise Plan Medbridge Access Code: [email or web address]   ? Consulted and Agree with Plan of Care Patient   ? ?  ?  ? ?  ? ? ? ?Loralyn FreshwaterMiguel Crystle Carelli PT, DPT ? ?05/02/2022, 2:06 PM ? ?  ? ?

## 2022-05-07 ENCOUNTER — Ambulatory Visit: Payer: BLUE CROSS/BLUE SHIELD

## 2022-05-07 DIAGNOSIS — M5459 Other low back pain: Secondary | ICD-10-CM | POA: Diagnosis not present

## 2022-05-07 DIAGNOSIS — M5432 Sciatica, left side: Secondary | ICD-10-CM

## 2022-05-07 DIAGNOSIS — M5417 Radiculopathy, lumbosacral region: Secondary | ICD-10-CM

## 2022-05-07 NOTE — Therapy (Signed)
[facility] ?[address] ?[place], [place], [address] ?Phone: [phone]   Fax:  [phone] ? ?Physical Therapy Treatment ? ?Patient Details  ?Name: Curtis Meyer ?MRN: [id number] ?Date of Birth: 2001-11-15 ?Referring Provider (PT): Charlynne Cousins, MD ? ? ?Encounter Date: 05/07/2022 ? ? PT End of Session - 05/07/22 0856   ? ? Visit Number 3   ? Number of Visits 17   ? Date for PT Re-Evaluation 06/21/22   ? Authorization Type BCBS Other 30 VL   ? Authorization Time Period 04/23/22-06/21/22   ? Progress Note Due on Visit 10   ? PT Start Time [phone]   ? PT Stop Time 0929   ? PT Time Calculation (min) 40 min   ? Activity Tolerance Patient tolerated treatment well;No increased pain   ? Behavior During Therapy [facility] for tasks assessed/performed   ? ?  ?  ? ?  ? ? ?Past Medical History:  ?Diagnosis Date  ? Asthma   ? Eczema   ? Sleep-disordered breathing   ? Tonsillar and adenoid hypertrophy   ? SNORING  ? ? ?Past Surgical History:  ?Procedure Laterality Date  ? TONSILLECTOMY AND ADENOIDECTOMY N/A 11/16/2015  ? Procedure: TONSILLECTOMY AND ADENOIDECTOMY;  Surgeon: Carloyn Manner, MD;  Location: [facility];  Service: ENT;  Laterality: N/A;  ADENOIDS CAUTERIZED NO TISSUE SENT  ? ? ?There were no vitals filed for this visit. ? ? Subjective Assessment - 05/07/22 0854   ? ? Subjective Pt doing ok today, reports pain in left posterior central hamstrings area. Pt reports a goog weekend. His HEP is going well, thinks its helping some.   ? Pertinent History Low back pain, L LE pain. Pain began about 1.5 years ago. Pt was working at [employer]. Lifted a mattress and felt pain in low back. Pain has improved since onset. Now his hamstrings hurt after playing soccer or running. Pain used to go down his leg (sciatic distribution), currently goes down behind his thigh not past the knee. L hamstrings pain begain about 2 months after back injury, unknown mechanism of onset. No L  hamstring swelling or back. No loss of bowel or bladder control. No LE paresthesia. Pt was at work this past Fri at [employer], liftted crate up, bent down and R low back started bothering him like 1.5 years ago but not as bad. No R LE paresthesia.   ? Currently in Pain? Yes   ? Pain Score 3    ? Pain Location --   Left central posterior thigh  ? ?  ?  ? ?  ? ? ?TODAY'S TREATMENT:  ?No latex allergies ?  ?  ?Therapeutic exercise ?  ?Standing L lateral shift correction 10x5 seconds for 2 sets ?Supine B piriformis stretch 1 minute x 3 ?Supine bridge 10x5 seconds for 3 sets ?Prone on elbows 2 minutes x 2 ?Supine Hip IR Stretch 1x60sec bilat  ?Repeated Extension into mobilization belt 20x sets L4, L2  ?Standing Hip extension with 5lb AW 1x10x3secH bilat, Hip ABDCT 1c10x3secH bilat  ? ? ? ? ? ? PT Education - 05/07/22 0901   ? ? Education Details reviewed form for certain exercises   ? Person(s) Educated Patient   ? Methods Explanation   ? Comprehension Verbalized understanding   ? ?  ?  ? ?  ? ? ? PT Short Term Goals - 04/23/22 1751   ? ?  ? PT SHORT TERM GOAL #1  ?  Title Pt will be independent with his initial HEP to decrease pain, improve strength, function, and ability to perform work related tasks as well as run more comfortably.   ? Baseline Pt has started his initial HEP (04/23/2022)   ? Time 3   ? Period Weeks   ? Status New   ? Target Date 05/17/22   ? ?  ?  ? ?  ? ? ? ? PT Long Term Goals - 04/23/22 1752   ? ?  ? PT LONG TERM GOAL #1  ? Title Pt will have a decrease in low back pain to 3/10 or less at worst to promote ability to lift, sit, as well as run more comfortably.   ? Baseline 7/10 R low back pain at worst for the past 3 months (04/23/2022)   ? Time 8   ? Period Weeks   ? Status New   ? Target Date 06/21/22   ?  ? PT LONG TERM GOAL #2  ? Title Pt will have a decrease in L LE pain to 4/10 or less at worst to promote ability to lift, sit, as well as run more comfortably.   ? Baseline 8/10 L hamstrings/posterior  thigh pain at worst for the past 3 months (04/23/2022)   ? Time 8   ? Period Weeks   ? Status New   ? Target Date 06/21/22   ?  ? PT LONG TERM GOAL #3  ? Title Pt will improve B hip IR by at least 15 degrees to promote ability to ambulate, run, lift with less back pain.   ? Baseline Hip PROM -16 degrees R, 17 degrees L (04/23/2022)   ? Time 8   ? Period Weeks   ? Status New   ? Target Date 06/21/22   ?  ? PT LONG TERM GOAL #4  ? Title Pt will improve his lumbar FOTO score by at least 10 points as a demonstration of improved function.   ? Baseline Lumbar spine FOTO: 25 (04/23/2022)   ? Time 8   ? Period Weeks   ? Status New   ? Target Date 06/21/22   ? ?  ?  ? ?  ? ? ? ? ? ? ? ? Plan - 05/07/22 0902   ? ? Clinical Impression Statement Continued with back based extension program. Session tolerated well in general. Symptoms continue to point to leg pain as radicular in nature. Pt has some exacerbated thigh pain after 2nd time in prone on elbows. At end of session, thigh pain is decreased in intensity, but no centralization phenomenon within session. Tactile and visual cues for new exercises.   ? Personal Factors and Comorbidities Time since onset of injury/illness/exacerbation;Profession   ? Examination-Activity Limitations Bend;Lift   ? Stability/Clinical Decision Making Stable/Uncomplicated   ? Clinical Decision Making Low   ? Rehab Potential Fair   ? PT Frequency 2x / week   ? PT Duration 8 weeks   ? PT Treatment/Interventions Therapeutic exercise;Therapeutic activities;Neuromuscular re-education;Patient/family education;Manual techniques;Dry needling;Spinal Manipulations;Joint Manipulations;Electrical Stimulation;Iontophoresis 4mg /ml Dexamethasone;Traction   ? PT Next Visit Plan posture, hip IR ROM, trunk and hip strengthening, manual techniques, modalities PRN   ? PT Home Exercise Plan Medbridge Access Code: [email or web address]   ? Consulted and Agree with Plan of Care Patient   ? ?  ?  ? ?  ? ? ?Patient will benefit from skilled  therapeutic intervention in order to improve the following deficits and impairments:  Pain, Postural dysfunction,  Improper body mechanics, Difficulty walking, Decreased range of motion ? ?Visit Diagnosis: ?Other low back pain ? ?Sciatica, left side ? ?Radiculopathy, lumbosacral region ? ? ? ? ?Problem List ?Patient Active Problem List  ? Diagnosis Date Noted  ? Chronic low back pain 04/11/2022  ? Left hamstring injury 04/11/2022  ? Pain of left lower extremity 04/11/2022  ? Acute left-sided low back pain with left-sided sciatica 03/01/2022  ? Mild intermittent asthma without complication [id number]  ? Intrinsic atopic dermatitis 06/29/2016  ? ?9:06 AM, 05/07/22 ?Etta Grandchild, PT, DPT ?Physical Therapist - [place] ?[phone] (Office) ? ? ?Xylah Early C, PT ?05/07/2022, 9:05 AM ? ?[facility] ?[address] ?[place], [place], [address] ?Phone: [phone]   Fax:  [phone] ? ?Name: Curtis Meyer ?MRN: [id number] ?Date of Birth: 2001/07/22 ? ? ? ?

## 2022-05-09 ENCOUNTER — Ambulatory Visit: Payer: BLUE CROSS/BLUE SHIELD

## 2022-05-09 DIAGNOSIS — M5459 Other low back pain: Secondary | ICD-10-CM | POA: Diagnosis not present

## 2022-05-09 DIAGNOSIS — M5432 Sciatica, left side: Secondary | ICD-10-CM

## 2022-05-09 DIAGNOSIS — M5417 Radiculopathy, lumbosacral region: Secondary | ICD-10-CM

## 2022-05-09 NOTE — Therapy (Signed)
?OUTPATIENT PHYSICAL THERAPY TREATMENT NOTE ? ? ?Patient Name: Curtis Meyer ?MRN: [id number] ?DOB:Jun 20, 2001, 21 y.o., male ?Today's Date: 05/09/2022 ? ?PCP: Charlynne Cousins, MD ?REFERRING PROVIDER: Charlynne Cousins, MD ? ? PT End of Session - 05/09/22 0850   ? ? Visit Number 4   ? Number of Visits 17   ? Date for PT Re-Evaluation 06/21/22   ? Authorization Type BCBS Other 30 VL   ? Authorization Time Period 04/23/22-06/21/22   ? Progress Note Due on Visit 10   ? PT Start Time [phone]   ? PT Stop Time 0929   ? PT Time Calculation (min) 40 min   ? Activity Tolerance Patient tolerated treatment well;No increased pain   ? Behavior During Therapy [facility] for tasks assessed/performed   ? ?  ?  ? ?  ? ? ? ?Past Medical History:  ?Diagnosis Date  ? Asthma   ? Eczema   ? Sleep-disordered breathing   ? Tonsillar and adenoid hypertrophy   ? SNORING  ? ?Past Surgical History:  ?Procedure Laterality Date  ? TONSILLECTOMY AND ADENOIDECTOMY N/A 11/16/2015  ? Procedure: TONSILLECTOMY AND ADENOIDECTOMY;  Surgeon: Carloyn Manner, MD;  Location: [facility];  Service: ENT;  Laterality: N/A;  ADENOIDS CAUTERIZED NO TISSUE SENT  ? ?Patient Active Problem List  ? Diagnosis Date Noted  ? Chronic low back pain 04/11/2022  ? Left hamstring injury 04/11/2022  ? Pain of left lower extremity 04/11/2022  ? Acute left-sided low back pain with left-sided sciatica 03/01/2022  ? Mild intermittent asthma without complication [id number]  ? Intrinsic atopic dermatitis 06/29/2016  ? ? ?REFERRING DIAG: M54.50,G89.29 (ICD-10-CM) - Chronic low back pain, unspecified back pain laterality, unspecified whether sciatica present  M79.605 (ICD-10-CM) - Pain of left lower extremity ? ?THERAPY DIAG:  ?Other low back pain ? ?Sciatica, left side ? ?Radiculopathy, lumbosacral region ? ?PERTINENT HISTORY: Low back pain, L LE pain. Pain began about 1.5 years ago. Pt was working at [employer]. Lifted a mattress and felt pain in low back. Pain has improved since onset. Now his  hamstrings hurt after playing soccer or running. Pain used to go down his leg (sciatic distribution), currently goes down behind his thigh not past the knee. L hamstrings pain begain about 2 months after back injury, unknown mechanism of onset. No L hamstring swelling or back. No loss of bowel or bladder control. No LE paresthesia. Pt was at work this past Fri at [employer], liftted crate up, bent down and R low back started bothering him like 1.5 years ago but not as bad. No R LE paresthesia. ? ?PRECAUTIONS: No known precautions ? ?SUBJECTIVE: Back has no pain, L thigh is 2/10 currently.  Doing his exercises at home.  ?PAIN:  ?Are you having pain?  0/10 low back, 2/10 L LE currently sitting and walking. ? ? ? ?TODAY'S TREATMENT:  ?No latex allergies ?  ?  ?Lifting 9.5 lb box: pt used hips and knees, back still curved, no pain.  ?Unknown mechanism of injury for L hamstring pain ?Eccentric L hamstring contraction in prone reproduced L hamstrings pain. No pain with L hamstrings concentric contraction.  ?  ?Long sit test suggests anterior nutation L innominate.  ?(+) Slump test L LE with reproduction of symptoms.  ?  ?  ?Therapeutic exercise ?  ? ?Standing L lateral shift correction 10x5 seconds for 2 sets ? ?Supine hip IR stretch with PT  ? R 2 minutes x 2 ? ?Supine hip at 90/90, hip IR  PROM ? R 10x3 ? ?Supine R piriformis stretch 1 minute x 3 ? ?Supine bridge with R LE march, emphasis on level pelvis to improve trunk and L glute max strength. 10x3 ? ?R side planks to decrease L lateral shift posture  ? ? ?Prone on elbows 2 minutes  ? ?Prone press ups 10x5 seconds ? ?Prone glute max extension  ? R 5x5 seconds for 3 sets ? L 5x5 seconds for 3 sets ? ? ?  ?Improved exercise technique, movement at target joints, use of target muscles after mod verbal, visual, tactile cues.  ?  ?  ?  ?Response to treatment ?Pt tolerated session well without aggravation of symptoms.  ? ?  ?  ?Clinical impression ?Pt making progress with  decreased L LE pain based on subjective reports. Continued working on improving R hip IR,  trunk and L glute strength as well as posture to decrease stress to low back and L sciatic nerve. Improved R hip IR observed. Pt tolerated session well without aggravation of symptoms. Pt will benefit from continued skilled physical therapy services to decrease pain, improve strength and function. ? ? ? ? ? ? ?PATIENT EDUCATION: ?Education details: ther-ex ?Person educated: Patient ?Education method: Explanation, Demonstration, Tactile cues, and Verbal cues ?Education comprehension: verbalized understanding and returned demonstration ? ? ?HOME EXERCISE PROGRAM: ?Medbridge Access Code: [email or web address] ?URL: [email or web address] ?Date: 04/23/2022 ?Prepared by: Joneen Boers ?  ?Exercises ?- Lateral Shift Correction at Wall  - 1 x daily - 7 x weekly - 3 sets - 10 reps - 5 seconds hold ? - Supine Piriformis Stretch  - 1 x daily - 7 x weekly - 3 sets - 10 reps - 1 minute hold ?- Supine Bridge  - 1 x daily - 7 x weekly - 3 sets - 10 reps - 5 seconds hold ?- Static Prone on Elbows  - 3 x daily - 7 x weekly - 1 sets - 2 reps - 2 minutes hold ?- Prone Hip Extension with Bent Knee  - 1 x daily - 7 x weekly - 3 sets - 5 reps - 5 seconds hold ? ? ? PT Short Term Goals - 04/23/22 1751   ? ?  ? PT SHORT TERM GOAL #1  ? Title Pt will be independent with his initial HEP to decrease pain, improve strength, function, and ability to perform work related tasks as well as run more comfortably.   ? Baseline Pt has started his initial HEP (04/23/2022)   ? Time 3   ? Period Weeks   ? Status New   ? Target Date 05/17/22   ? ?  ?  ? ?  ? ? ? PT Long Term Goals - 04/23/22 1752   ? ?  ? PT LONG TERM GOAL #1  ? Title Pt will have a decrease in low back pain to 3/10 or less at worst to promote ability to lift, sit, as well as run more comfortably.   ? Baseline 7/10 R low back pain at worst for the past 3 months (04/23/2022)   ? Time 8   ? Period Weeks    ? Status New   ? Target Date 06/21/22   ?  ? PT LONG TERM GOAL #2  ? Title Pt will have a decrease in L LE pain to 4/10 or less at worst to promote ability to lift, sit, as well as run more comfortably.   ? Baseline 8/10 L hamstrings/posterior thigh pain at worst for  the past 3 months (04/23/2022)   ? Time 8   ? Period Weeks   ? Status New   ? Target Date 06/21/22   ?  ? PT LONG TERM GOAL #3  ? Title Pt will improve B hip IR by at least 15 degrees to promote ability to ambulate, run, lift with less back pain.   ? Baseline Hip PROM -16 degrees R, 17 degrees L (04/23/2022)   ? Time 8   ? Period Weeks   ? Status New   ? Target Date 06/21/22   ?  ? PT LONG TERM GOAL #4  ? Title Pt will improve his lumbar FOTO score by at least 10 points as a demonstration of improved function.   ? Baseline Lumbar spine FOTO: 25 (04/23/2022)   ? Time 8   ? Period Weeks   ? Status New   ? Target Date 06/21/22   ? ?  ?  ? ?  ? ? ? Plan - 05/09/22 0853   ? ? Clinical Impression Statement Pt making progress with decreased L LE pain based on subjective reports. Continued working on improving R hip IR,  trunk and L glute strength as well as posture to decrease stress to low back and L sciatic nerve. Improved R hip IR observed. Pt tolerated session well without aggravation of symptoms. Pt will benefit from continued skilled physical therapy services to decrease pain, improve strength and function.   ? Personal Factors and Comorbidities Time since onset of injury/illness/exacerbation;Profession   ? Examination-Activity Limitations Bend;Lift   ? Stability/Clinical Decision Making Stable/Uncomplicated   ? Rehab Potential Fair   ? PT Frequency 2x / week   ? PT Duration 8 weeks   ? PT Treatment/Interventions Therapeutic exercise;Therapeutic activities;Neuromuscular re-education;Patient/family education;Manual techniques;Dry needling;Spinal Manipulations;Joint Manipulations;Electrical Stimulation;Iontophoresis 4mg /ml Dexamethasone;Traction   ? PT Next  Visit Plan posture, hip IR ROM, trunk and hip strengthening, manual techniques, modalities PRN   ? PT Home Exercise Plan Medbridge Access Code: [email or web address]   ? Consulted and Agree with Plan of Care Patient   ? ?  ?  ?

## 2022-05-15 ENCOUNTER — Ambulatory Visit: Payer: BLUE CROSS/BLUE SHIELD

## 2022-05-15 ENCOUNTER — Telehealth: Payer: Self-pay

## 2022-05-15 NOTE — Telephone Encounter (Signed)
No show. Called patient and left a message pertaining to appointment and a reminder for the next follow up session. Return phone call requested. Phone number ([phone]) provided.   

## 2022-05-16 ENCOUNTER — Ambulatory Visit: Payer: BLUE CROSS/BLUE SHIELD

## 2022-05-17 ENCOUNTER — Ambulatory Visit: Payer: BLUE CROSS/BLUE SHIELD

## 2022-05-17 DIAGNOSIS — M5417 Radiculopathy, lumbosacral region: Secondary | ICD-10-CM

## 2022-05-17 DIAGNOSIS — M5432 Sciatica, left side: Secondary | ICD-10-CM

## 2022-05-17 DIAGNOSIS — M5459 Other low back pain: Secondary | ICD-10-CM

## 2022-05-17 NOTE — Therapy (Signed)
OUTPATIENT PHYSICAL THERAPY TREATMENT NOTE   Patient Name: Curtis Meyer MRN: [id number] DOB:[birth year], 21 y.o., male Today's Date: 05/17/2022  PCP: Charlynne Cousins, MD REFERRING PROVIDER: Charlynne Cousins, MD   PT End of Session - 05/17/22 1635     Visit Number 5    Number of Visits 17    Date for PT Re-Evaluation 06/21/22    Authorization Type BCBS Other 30 VL    Authorization Time Period 04/23/22-06/21/22    Progress Note Due on Visit 10    PT Start Time 1635    PT Stop Time 1714    PT Time Calculation (min) 39 min    Activity Tolerance Patient tolerated treatment well;No increased pain    Behavior During Therapy WFL for tasks assessed/performed               Past Medical History:  Diagnosis Date   Asthma    Eczema    Sleep-disordered breathing    Tonsillar and adenoid hypertrophy    SNORING   Past Surgical History:  Procedure Laterality Date   TONSILLECTOMY AND ADENOIDECTOMY N/A 11/16/2015   Procedure: TONSILLECTOMY AND ADENOIDECTOMY;  Surgeon: Carloyn Manner, MD;  Location: [facility];  Service: ENT;  Laterality: N/A;  ADENOIDS CAUTERIZED NO TISSUE SENT   Patient Active Problem List   Diagnosis Date Noted   Chronic low back pain 04/11/2022   Left hamstring injury 04/11/2022   Pain of left lower extremity 04/11/2022   Acute left-sided low back pain with left-sided sciatica 03/01/2022   Mild intermittent asthma without complication [id number]   Intrinsic atopic dermatitis 06/29/2016    REFERRING DIAG: M54.50,G89.29 (ICD-10-CM) - Chronic low back pain, unspecified back pain laterality, unspecified whether sciatica present  M79.605 (ICD-10-CM) - Pain of left lower extremity  THERAPY DIAG:  Other low back pain  Sciatica, left side  Radiculopathy, lumbosacral region  PERTINENT HISTORY: Low back pain, L LE pain. Pain began about 1.5 years ago. Pt was working at [employer]. Lifted a mattress and felt pain in low back. Pain has improved since onset. Now his  hamstrings hurt after playing soccer or running. Pain used to go down his leg (sciatic distribution), currently goes down behind his thigh not past the knee. L hamstrings pain begain about 2 months after back injury, unknown mechanism of onset. No L hamstring swelling or back. No loss of bowel or bladder control. No LE paresthesia. Pt was at work this past Fri at [employer], liftted crate up, bent down and R low back started bothering him like 1.5 years ago but not as bad. No R LE paresthesia.  PRECAUTIONS: No known precautions  SUBJECTIVE: Back is good. L hamstrings 1-2/10 currently. Doing his HEP.    PAIN:  Are you having pain?  0/10 low back, 1-2/10 L LE currently    TODAY'S TREATMENT:  No latex allergies     Lifting 9.5 lb box: pt used hips and knees, back still curved, no pain.  Unknown mechanism of injury for L hamstring pain Eccentric L hamstring contraction in prone reproduced L hamstrings pain. No pain with L hamstrings concentric contraction.    Long sit test suggests anterior nutation L innominate.  (+) Slump test L LE with reproduction of symptoms.      Therapeutic exercise    Standing L lateral shift correction 10x5 seconds for 2 sets  Prone press-ups 10x5 seconds for 3 sets   Supine hip IR stretch with PT   R 2 minutes x 2  Supine hip  at 90/90, hip IR PROM  R 30x   Supine R piriformis stretch 1 minute x 2   Supine bridge with R LE march, emphasis on level pelvis to improve trunk and L glute max strength. 10x3  R side planks to decrease L lateral shift posture 5x5 seconds    Prone glute max extension   R 5x5 seconds for 3 sets  L 5x5 seconds for 3 sets   Seated manually resisted R lateral shift isometrics in neutral to counter L lateral shift posture 10x5 seconds for 2 sets      Improved exercise technique, movement at target joints, use of target muscles after mod verbal, visual, tactile cues.        Response to treatment Pt tolerated session  well without aggravation of symptoms. No L LE pain reported after session.       Clinical impression Continued working on improving posture, lumbar extension, core and glute strength and R hip IR to decrease stress to affected areas. No L LE pain reported after session. Pt will benefit from continued skilled physical therapy services to decrease pain, improve strength and function.       PATIENT EDUCATION: Education details: ther-ex Person educated: Patient Education method: Explanation, Demonstration, Corporate treasurer cues, and Verbal cues Education comprehension: verbalized understanding and returned demonstration   HOME EXERCISE PROGRAM: Medbridge Access Code: [email or web address] URL: [email or web address] Date: 04/23/2022 Prepared by: Joneen Boers   Exercises - Lateral Shift Correction at [place]  - 1 x daily - 7 x weekly - 3 sets - 10 reps - 5 seconds hold  - Supine Piriformis Stretch  - 1 x daily - 7 x weekly - 3 sets - 10 reps - 1 minute hold - Supine Bridge  - 1 x daily - 7 x weekly - 3 sets - 10 reps - 5 seconds hold - Static Prone on Elbows  - 3 x daily - 7 x weekly - 1 sets - 2 reps - 2 minutes hold - Prone Hip Extension with Bent Knee  - 1 x daily - 7 x weekly - 3 sets - 5 reps - 5 seconds hold    PT Short Term Goals - 04/23/22 1751       PT SHORT TERM GOAL #1   Title Pt will be independent with his initial HEP to decrease pain, improve strength, function, and ability to perform work related tasks as well as run more comfortably.    Baseline Pt has started his initial HEP (04/23/2022)    Time 3    Period Weeks    Status New    Target Date 05/17/22              PT Long Term Goals - 04/23/22 1752       PT LONG TERM GOAL #1   Title Pt will have a decrease in low back pain to 3/10 or less at worst to promote ability to lift, sit, as well as run more comfortably.    Baseline 7/10 R low back pain at worst for the past 3 months (04/23/2022)    Time 8    Period Weeks     Status New    Target Date 06/21/22      PT LONG TERM GOAL #2   Title Pt will have a decrease in L LE pain to 4/10 or less at worst to promote ability to lift, sit, as well as run more comfortably.    Baseline 8/10 L hamstrings/posterior thigh pain at worst  for the past 3 months (04/23/2022)    Time 8    Period Weeks    Status New    Target Date 06/21/22      PT LONG TERM GOAL #3   Title Pt will improve B hip IR by at least 15 degrees to promote ability to ambulate, run, lift with less back pain.    Baseline Hip PROM -16 degrees R, 17 degrees L (04/23/2022)    Time 8    Period Weeks    Status New    Target Date 06/21/22      PT LONG TERM GOAL #4   Title Pt will improve his lumbar FOTO score by at least 10 points as a demonstration of improved function.    Baseline Lumbar spine FOTO: 25 (04/23/2022)    Time 8    Period Weeks    Status New    Target Date 06/21/22              Plan - 05/17/22 1634     Clinical Impression Statement Continued working on improving posture, lumbar extension, core and glute strength and R hip IR to decrease stress to affected areas. No L LE pain reported after session. Pt will benefit from continued skilled physical therapy services to decrease pain, improve strength and function.    Personal Factors and Comorbidities Time since onset of injury/illness/exacerbation;Profession    Examination-Activity Limitations Bend;Lift    Stability/Clinical Decision Making Stable/Uncomplicated    Rehab Potential Fair    PT Frequency 2x / week    PT Duration 8 weeks    PT Treatment/Interventions Therapeutic exercise;Therapeutic activities;Neuromuscular re-education;Patient/family education;Manual techniques;Dry needling;Spinal Manipulations;Joint Manipulations;Electrical Stimulation;Iontophoresis 4mg /ml Dexamethasone;Traction    PT Next Visit Plan posture, hip IR ROM, trunk and hip strengthening, manual techniques, modalities PRN    PT Home Exercise Plan Medbridge  Access Code: [email or web address]    Consulted and Agree with Plan of Care Patient                Joneen Boers PT, DPT  05/17/2022, 5:30 PM

## 2022-05-18 DIAGNOSIS — J301 Allergic rhinitis due to pollen: Secondary | ICD-10-CM | POA: Diagnosis not present

## 2022-05-23 ENCOUNTER — Ambulatory Visit: Payer: BLUE CROSS/BLUE SHIELD

## 2022-05-28 ENCOUNTER — Ambulatory Visit: Payer: BLUE CROSS/BLUE SHIELD

## 2022-05-30 ENCOUNTER — Ambulatory Visit: Payer: BLUE CROSS/BLUE SHIELD | Attending: Internal Medicine

## 2022-05-30 DIAGNOSIS — M5459 Other low back pain: Secondary | ICD-10-CM | POA: Diagnosis present

## 2022-05-30 DIAGNOSIS — M5432 Sciatica, left side: Secondary | ICD-10-CM

## 2022-05-30 DIAGNOSIS — M5417 Radiculopathy, lumbosacral region: Secondary | ICD-10-CM

## 2022-05-30 NOTE — Therapy (Signed)
OUTPATIENT PHYSICAL THERAPY TREATMENT NOTE   Patient Name: Curtis Meyer MRN: [id number] DOB:03-12-2001, 21 y.o., male Today's Date: 05/30/2022  PCP: Loura Pardon, MD REFERRING PROVIDER: Loura Pardon, MD   PT End of Session - 05/30/22 1723     Visit Number 6    Number of Visits 17    Date for PT Re-Evaluation 06/21/22    Authorization Type BCBS Other 30 VL    Authorization Time Period 04/23/22-06/21/22    Progress Note Due on Visit 10    PT Start Time 1724    PT Stop Time 1748    PT Time Calculation (min) 24 min    Activity Tolerance Patient tolerated treatment well;No increased pain    Behavior During Therapy WFL for tasks assessed/performed                Past Medical History:  Diagnosis Date   Asthma    Eczema    Sleep-disordered breathing    Tonsillar and adenoid hypertrophy    SNORING   Past Surgical History:  Procedure Laterality Date   TONSILLECTOMY AND ADENOIDECTOMY N/A 11/16/2015   Procedure: TONSILLECTOMY AND ADENOIDECTOMY;  Surgeon: Bud Face, MD;  Location: [facility];  Service: ENT;  Laterality: N/A;  ADENOIDS CAUTERIZED NO TISSUE SENT   Patient Active Problem List   Diagnosis Date Noted   Chronic low back pain 04/11/2022   Left hamstring injury 04/11/2022   Pain of left lower extremity 04/11/2022   Acute left-sided low back pain with left-sided sciatica 03/01/2022   Mild intermittent asthma without complication 03/01/2022   Intrinsic atopic dermatitis 06/29/2016    REFERRING DIAG: M54.50,G89.29 (ICD-10-CM) - Chronic low back pain, unspecified back pain laterality, unspecified whether sciatica present  M79.605 (ICD-10-CM) - Pain of left lower extremity  THERAPY DIAG:  Other low back pain  Sciatica, left side  Radiculopathy, lumbosacral region  PERTINENT HISTORY: Low back pain, L LE pain. Pain began about 1.5 years ago. Pt was working at [employer]. Lifted a mattress and felt pain in low back. Pain has improved since onset. Now his  hamstrings hurt after playing soccer or running. Pain used to go down his leg (sciatic distribution), currently goes down behind his thigh not past the knee. L hamstrings pain begain about 2 months after back injury, unknown mechanism of onset. No L hamstring swelling or back. No loss of bowel or bladder control. No LE paresthesia. Pt was at work this past Fri at [employer], liftted crate up, bent down and R low back started bothering him like 1.5 years ago but not as bad. No R LE paresthesia.  PRECAUTIONS: No known precautions  SUBJECTIVE: Feeling better, no shortness of breath. Went back to work today. Back feels good. L hamstrings feels good. No pain currently. 1/10 low back, 1-2/10 L posterior thigh pain at most for the past 7 days.    PAIN:  Are you having pain?  0/10   TODAY'S TREATMENT:  No latex allergies     Lifting 9.5 lb box: pt used hips and knees, back still curved, no pain.  Unknown mechanism of injury for L hamstring pain Eccentric L hamstring contraction in prone reproduced L hamstrings pain. No pain with L hamstrings concentric contraction.    Long sit test suggests anterior nutation L innominate.  (+) Slump test L LE with reproduction of symptoms.      Therapeutic exercise    Standing L lateral shift correction 10x5 seconds for 2 sets   Standing back extension 10x5  seconds for 2 sets  Supine R piriformis stretch 1 minute x 2  Supine bridge with R LE march, emphasis on level pelvis to improve trunk and L glute max strength. 10x3  R side planks to decrease L lateral shift posture 5x5 seconds  Prone glute max extension   R 5x5 seconds for 1 sets  L 5x5 seconds for 1 sets     Improved exercise technique, movement at target joints, use of target muscles after mod verbal, visual, tactile cues.        Response to treatment Pt tolerated session well without aggravation of symptoms. No L LE pain reported after session.       Clinical impression Lighter session  today secondary to pt recovering from COVID. Continued working on improving posture, lumbar extension, core and glute strength and R hip IR to decrease stress to affected areas. Pt tolerated session well without aggravation of symptoms. Pt will benefit from continued skilled physical therapy services to decrease pain, improve strength and function.       PATIENT EDUCATION: Education details: ther-ex Person educated: Patient Education method: Explanation, Demonstration, ActorTactile cues, and Verbal cues Education comprehension: verbalized understanding and returned demonstration   HOME EXERCISE PROGRAM: Medbridge Access Code: [email or web address] URL: [email or web address] Date: 04/23/2022 Prepared by: Loralyn FreshwaterMiguel Evalyn Shultis   Exercises - Lateral Shift Correction at Wall  - 1 x daily - 7 x weekly - 3 sets - 10 reps - 5 seconds hold  - Supine Piriformis Stretch  - 1 x daily - 7 x weekly - 3 sets - 10 reps - 1 minute hold - Supine Bridge  - 1 x daily - 7 x weekly - 3 sets - 10 reps - 5 seconds hold - Static Prone on Elbows  - 3 x daily - 7 x weekly - 1 sets - 2 reps - 2 minutes hold - Prone Hip Extension with Bent Knee  - 1 x daily - 7 x weekly - 3 sets - 5 reps - 5 seconds hold    PT Short Term Goals - 04/23/22 1751       PT SHORT TERM GOAL #1   Title Pt will be independent with his initial HEP to decrease pain, improve strength, function, and ability to perform work related tasks as well as run more comfortably.    Baseline Pt has started his initial HEP (04/23/2022)    Time 3    Period Weeks    Status New    Target Date 05/17/22              PT Long Term Goals - 04/23/22 1752       PT LONG TERM GOAL #1   Title Pt will have a decrease in low back pain to 3/10 or less at worst to promote ability to lift, sit, as well as run more comfortably.    Baseline 7/10 R low back pain at worst for the past 3 months (04/23/2022)    Time 8    Period Weeks    Status New    Target Date 06/21/22       PT LONG TERM GOAL #2   Title Pt will have a decrease in L LE pain to 4/10 or less at worst to promote ability to lift, sit, as well as run more comfortably.    Baseline 8/10 L hamstrings/posterior thigh pain at worst for the past 3 months (04/23/2022)    Time 8    Period Weeks    Status New  Target Date 06/21/22      PT LONG TERM GOAL #3   Title Pt will improve B hip IR by at least 15 degrees to promote ability to ambulate, run, lift with less back pain.    Baseline Hip PROM -16 degrees R, 17 degrees L (04/23/2022)    Time 8    Period Weeks    Status New    Target Date 06/21/22      PT LONG TERM GOAL #4   Title Pt will improve his lumbar FOTO score by at least 10 points as a demonstration of improved function.    Baseline Lumbar spine FOTO: 25 (04/23/2022)    Time 8    Period Weeks    Status New    Target Date 06/21/22              Plan - 05/30/22 1759     Clinical Impression Statement Lighter session today secondary to pt recovering from COVID. Continued working on improving posture, lumbar extension, core and glute strength and R hip IR to decrease stress to affected areas. Pt tolerated session well without aggravation of symptoms. Pt will benefit from continued skilled physical therapy services to decrease pain, improve strength and function.    Personal Factors and Comorbidities Time since onset of injury/illness/exacerbation;Profession    Examination-Activity Limitations Bend;Lift    Stability/Clinical Decision Making Stable/Uncomplicated    Rehab Potential Fair    PT Frequency 2x / week    PT Duration 8 weeks    PT Treatment/Interventions Therapeutic exercise;Therapeutic activities;Neuromuscular re-education;Patient/family education;Manual techniques;Dry needling;Spinal Manipulations;Joint Manipulations;Electrical Stimulation;Iontophoresis 4mg /ml Dexamethasone;Traction    PT Next Visit Plan posture, hip IR ROM, trunk and hip strengthening, manual techniques, modalities  PRN    PT Home Exercise Plan Medbridge Access Code: [email or web address]    Consulted and Agree with Plan of Care Patient                 PT, DPT  05/30/2022, 6:00 PM

## 2022-05-31 DIAGNOSIS — J301 Allergic rhinitis due to pollen: Secondary | ICD-10-CM | POA: Diagnosis not present

## 2022-06-04 ENCOUNTER — Ambulatory Visit: Payer: BLUE CROSS/BLUE SHIELD | Attending: Internal Medicine

## 2022-06-04 DIAGNOSIS — M5417 Radiculopathy, lumbosacral region: Secondary | ICD-10-CM | POA: Insufficient documentation

## 2022-06-04 DIAGNOSIS — M5459 Other low back pain: Secondary | ICD-10-CM | POA: Diagnosis present

## 2022-06-04 DIAGNOSIS — M5432 Sciatica, left side: Secondary | ICD-10-CM | POA: Diagnosis present

## 2022-06-04 NOTE — Therapy (Signed)
OUTPATIENT PHYSICAL THERAPY TREATMENT NOTE   Patient Name: Curtis Meyer MRN: [id number] DOB:[birth year], 21 y.o., male Today's Date: 06/04/2022  PCP: Loura Pardon, MD REFERRING PROVIDER: Loura Pardon, MD   PT End of Session - 06/04/22 1721     Visit Number 7    Number of Visits 17    Date for PT Re-Evaluation 06/21/22    Authorization Type BCBS Other 30 VL    Authorization Time Period 04/23/22-06/21/22    Progress Note Due on Visit 10    PT Start Time 1721    PT Stop Time 1800    PT Time Calculation (min) 39 min    Activity Tolerance Patient tolerated treatment well;No increased pain    Behavior During Therapy WFL for tasks assessed/performed                 Past Medical History:  Diagnosis Date   Asthma    Eczema    Sleep-disordered breathing    Tonsillar and adenoid hypertrophy    SNORING   Past Surgical History:  Procedure Laterality Date   TONSILLECTOMY AND ADENOIDECTOMY N/A 11/16/2015   Procedure: TONSILLECTOMY AND ADENOIDECTOMY;  Surgeon: Bud Face, MD;  Location: [facility];  Service: ENT;  Laterality: N/A;  ADENOIDS CAUTERIZED NO TISSUE SENT   Patient Active Problem List   Diagnosis Date Noted   Chronic low back pain 04/11/2022   Left hamstring injury 04/11/2022   Pain of left lower extremity 04/11/2022   Acute left-sided low back pain with left-sided sciatica 03/01/2022   Mild intermittent asthma without complication 03/01/2022   Intrinsic atopic dermatitis 06/29/2016    REFERRING DIAG: M54.50,G89.29 (ICD-10-CM) - Chronic low back pain, unspecified back pain laterality, unspecified whether sciatica present  M79.605 (ICD-10-CM) - Pain of left lower extremity  THERAPY DIAG:  Other low back pain  Sciatica, left side  Radiculopathy, lumbosacral region  PERTINENT HISTORY: Low back pain, L LE pain. Pain began about 1.5 years ago. Pt was working at [employer]. Lifted a mattress and felt pain in low back. Pain has improved since onset. Now  his hamstrings hurt after playing soccer or running. Pain used to go down his leg (sciatic distribution), currently goes down behind his thigh not past the knee. L hamstrings pain begain about 2 months after back injury, unknown mechanism of onset. No L hamstring swelling or back. No loss of bowel or bladder control. No LE paresthesia. Pt was at work this past Fri at [employer], liftted crate up, bent down and R low back started bothering him like 1.5 years ago but not as bad. No R LE paresthesia.  PRECAUTIONS: No known precautions  SUBJECTIVE: No pain in back. 2/10 L posterior thigh pain currently, 3/10 at most for the past 7 days.    PAIN:  Are you having pain?  2/10 L posterior thigh.    TODAY'S TREATMENT:  No latex allergies         Therapeutic exercise    Standing L lateral shift correction 10x5 seconds for 2 sets  Standing back extension 10x10 seconds for 2 sets  Self posterior capsule stretch   R 30 seconds x 3  Supine R piriformis stretch 1 minute x 2  R side planks to decrease L lateral shift posture 10x5 seconds  Supine bridge with R LE march, emphasis on level pelvis to improve trunk and L glute max strength. 10x3  Prone glute max extension   R 5x5 seconds for 2 sets  L 5x5 seconds for 2  sets  Prone on elbows 2 minutes to promote lumbar extension.   Prone press-ups 10x5 seconds for 2 sets     Improved exercise technique, movement at target joints, use of target muscles after mod verbal, visual, tactile cues.        Response to treatment Pt tolerated session well without aggravation of symptoms. No back or L posterior thigh symptoms after session.      Clinical impression Pt making very good progress with decreased low back and L posterior thigh pain based on subjective reports. Continued working on improving posture, lumbar extension, core and glute strength and R hip IR to decrease stress to affected areas. Pt tolerated session well without aggravation of  symptoms. No back or L posterior thigh symptoms after session.  Pt will benefit from continued skilled physical therapy services to decrease pain, improve strength and function.       PATIENT EDUCATION: Education details: ther-ex Person educated: Patient Education method: Explanation, Demonstration, Actor cues, and Verbal cues Education comprehension: verbalized understanding and returned demonstration   HOME EXERCISE PROGRAM: Medbridge Access Code: [email or web address] URL: [email or web address] Date: 04/23/2022 Prepared by: Loralyn Freshwater   Exercises - Lateral Shift Correction at Wall  - 1 x daily - 7 x weekly - 3 sets - 10 reps - 5 seconds hold  - Supine Piriformis Stretch  - 1 x daily - 7 x weekly - 3 sets - 10 reps - 1 minute hold - Supine Bridge  - 1 x daily - 7 x weekly - 3 sets - 10 reps - 5 seconds hold - Static Prone on Elbows  - 3 x daily - 7 x weekly - 1 sets - 2 reps - 2 minutes hold - Prone Hip Extension with Bent Knee  - 1 x daily - 7 x weekly - 3 sets - 5 reps - 5 seconds hold    PT Short Term Goals - 04/23/22 1751       PT SHORT TERM GOAL #1   Title Pt will be independent with his initial HEP to decrease pain, improve strength, function, and ability to perform work related tasks as well as run more comfortably.    Baseline Pt has started his initial HEP (04/23/2022)    Time 3    Period Weeks    Status New    Target Date 05/17/22              PT Long Term Goals - 04/23/22 1752       PT LONG TERM GOAL #1   Title Pt will have a decrease in low back pain to 3/10 or less at worst to promote ability to lift, sit, as well as run more comfortably.    Baseline 7/10 R low back pain at worst for the past 3 months (04/23/2022)    Time 8    Period Weeks    Status New    Target Date 06/21/22      PT LONG TERM GOAL #2   Title Pt will have a decrease in L LE pain to 4/10 or less at worst to promote ability to lift, sit, as well as run more comfortably.     Baseline 8/10 L hamstrings/posterior thigh pain at worst for the past 3 months (04/23/2022)    Time 8    Period Weeks    Status New    Target Date 06/21/22      PT LONG TERM GOAL #3   Title Pt will improve B hip IR  by at least 15 degrees to promote ability to ambulate, run, lift with less back pain.    Baseline Hip PROM -16 degrees R, 17 degrees L (04/23/2022)    Time 8    Period Weeks    Status New    Target Date 06/21/22      PT LONG TERM GOAL #4   Title Pt will improve his lumbar FOTO score by at least 10 points as a demonstration of improved function.    Baseline Lumbar spine FOTO: 25 (04/23/2022)    Time 8    Period Weeks    Status New    Target Date 06/21/22              Plan - 06/04/22 1812     Clinical Impression Statement Pt making very good progress with decreased low back and L posterior thigh pain based on subjective reports. Continued working on improving posture, lumbar extension, core and glute strength and R hip IR to decrease stress to affected areas. Pt tolerated session well without aggravation of symptoms. No back or L posterior thigh symptoms after session.  Pt will benefit from continued skilled physical therapy services to decrease pain, improve strength and function.    Personal Factors and Comorbidities Time since onset of injury/illness/exacerbation;Profession    Examination-Activity Limitations Bend;Lift    Stability/Clinical Decision Making Stable/Uncomplicated    Rehab Potential Fair    PT Frequency 2x / week    PT Duration 8 weeks    PT Treatment/Interventions Therapeutic exercise;Therapeutic activities;Neuromuscular re-education;Patient/family education;Manual techniques;Dry needling;Spinal Manipulations;Joint Manipulations;Electrical Stimulation;Iontophoresis 4mg /ml Dexamethasone;Traction    PT Next Visit Plan posture, hip IR ROM, trunk and hip strengthening, manual techniques, modalities PRN    PT Home Exercise Plan Medbridge Access Code: [email or web address]     Consulted and Agree with Plan of Care Patient                  Loralyn FreshwaterMiguel Janazia Schreier PT, DPT  06/04/2022, 6:14 PM

## 2022-06-06 ENCOUNTER — Ambulatory Visit: Payer: BLUE CROSS/BLUE SHIELD

## 2022-06-06 DIAGNOSIS — M5417 Radiculopathy, lumbosacral region: Secondary | ICD-10-CM

## 2022-06-06 DIAGNOSIS — M5459 Other low back pain: Secondary | ICD-10-CM

## 2022-06-06 DIAGNOSIS — M5432 Sciatica, left side: Secondary | ICD-10-CM

## 2022-06-06 NOTE — Therapy (Signed)
OUTPATIENT PHYSICAL THERAPY TREATMENT NOTE   Patient Name: Curtis Meyer MRN: [id number] DOB:2001/07/29, 21 y.o., male Today's Date: 06/06/2022  PCP: Loura PardonVigg, Avanti, MD REFERRING PROVIDER: Loura PardonVigg, Avanti, MD   PT End of Session - 06/06/22 1724     Visit Number 8    Number of Visits 17    Date for PT Re-Evaluation 06/21/22    Authorization Type BCBS Other 30 VL    Authorization Time Period 04/23/22-06/21/22    Progress Note Due on Visit 10    PT Start Time 1724    PT Stop Time 1804    PT Time Calculation (min) 40 min    Activity Tolerance Patient tolerated treatment well;No increased pain    Behavior During Therapy WFL for tasks assessed/performed                  Past Medical History:  Diagnosis Date   Asthma    Eczema    Sleep-disordered breathing    Tonsillar and adenoid hypertrophy    SNORING   Past Surgical History:  Procedure Laterality Date   TONSILLECTOMY AND ADENOIDECTOMY N/A 11/16/2015   Procedure: TONSILLECTOMY AND ADENOIDECTOMY;  Surgeon: Bud Facereighton Vaught, MD;  Location: [facility];  Service: ENT;  Laterality: N/A;  ADENOIDS CAUTERIZED NO TISSUE SENT   Patient Active Problem List   Diagnosis Date Noted   Chronic low back pain 04/11/2022   Left hamstring injury 04/11/2022   Pain of left lower extremity 04/11/2022   Acute left-sided low back pain with left-sided sciatica 03/01/2022   Mild intermittent asthma without complication 03/01/2022   Intrinsic atopic dermatitis 06/29/2016    REFERRING DIAG: M54.50,G89.29 (ICD-10-CM) - Chronic low back pain, unspecified back pain laterality, unspecified whether sciatica present  M79.605 (ICD-10-CM) - Pain of left lower extremity  THERAPY DIAG:  Other low back pain  Sciatica, left side  Radiculopathy, lumbosacral region  PERTINENT HISTORY: Low back pain, L LE pain. Pain began about 1.5 years ago. Pt was working at [employer]. Lifted a mattress and felt pain in low back. Pain has improved since onset. Now  his hamstrings hurt after playing soccer or running. Pain used to go down his leg (sciatic distribution), currently goes down behind his thigh not past the knee. L hamstrings pain begain about 2 months after back injury, unknown mechanism of onset. No L hamstring swelling or back. No loss of bowel or bladder control. No LE paresthesia. Pt was at work this past Fri at [employer], liftted crate up, bent down and R low back started bothering him like 1.5 years ago but not as bad. No R LE paresthesia.  PRECAUTIONS: No known precautions  SUBJECTIVE: L hamstring is good. However started feeling pain in his low back again yesterday. Pt was just sitting down and his back started to hurt.      PAIN:  Are you having pain?  0/10 L posterior thigh and low back pain   TODAY'S TREATMENT:  No latex allergies         Therapeutic exercise   Sitting with lumbar towel roll x 2 min  Reviewed and given as part of HEP to decrease back pain when sitting.   Seated manually resisted R lateral shift isometrics, in neutral to correct posture 10x5 seconds for 3 sets  Seated manually resisted L upper trunk rotation to promote more neutral low back posture 10x5 seconds for 3 sets  Seated thoracic extension over chair back 10x5 seconds for 3 sets  Self posterior capsule stretch  R 30 seconds x 3  Seated hip IR   R 10x3   Static lunge   R 10x2  L 10x2  Sitting with upright posture and B scapular retraction  Manually resisted trunk extension isometrics to help decrease lumbar flexion posture in sitting 10x5 seconds for 3 sets      Improved exercise technique, movement at target joints, use of target muscles after mod verbal, visual, tactile cues.        Response to treatment Pt tolerated session well without aggravation of symptoms.     Clinical impression Continued working on improving posture, lumbar extension, core and glute strength and R hip IR to decrease stress to affected areas. Pt  tolerated session well without aggravation of symptoms. No back or L posterior thigh symptoms after session.  Pt will benefit from continued skilled physical therapy services to decrease pain, improve strength and function.       PATIENT EDUCATION: Education details: ther-ex Person educated: Patient Education method: Explanation, Demonstration, Actor cues, and Verbal cues Education comprehension: verbalized understanding and returned demonstration   HOME EXERCISE PROGRAM: Medbridge Access Code: [email or web address] URL: [email or web address] Date: 04/23/2022 Prepared by: Loralyn Freshwater   Exercises - Lateral Shift Correction at Wall  - 1 x daily - 7 x weekly - 3 sets - 10 reps - 5 seconds hold  - Supine Piriformis Stretch  - 1 x daily - 7 x weekly - 3 sets - 10 reps - 1 minute hold - Supine Bridge  - 1 x daily - 7 x weekly - 3 sets - 10 reps - 5 seconds hold - Static Prone on Elbows  - 3 x daily - 7 x weekly - 1 sets - 2 reps - 2 minutes hold - Prone Hip Extension with Bent Knee  - 1 x daily - 7 x weekly - 3 sets - 5 reps - 5 seconds hold  Sitting with lumbar towel roll x 2 min   PT Short Term Goals - 04/23/22 1751       PT SHORT TERM GOAL #1   Title Pt will be independent with his initial HEP to decrease pain, improve strength, function, and ability to perform work related tasks as well as run more comfortably.    Baseline Pt has started his initial HEP (04/23/2022)    Time 3    Period Weeks    Status New    Target Date 05/17/22              PT Long Term Goals - 04/23/22 1752       PT LONG TERM GOAL #1   Title Pt will have a decrease in low back pain to 3/10 or less at worst to promote ability to lift, sit, as well as run more comfortably.    Baseline 7/10 R low back pain at worst for the past 3 months (04/23/2022)    Time 8    Period Weeks    Status New    Target Date 06/21/22      PT LONG TERM GOAL #2   Title Pt will have a decrease in L LE pain to 4/10 or  less at worst to promote ability to lift, sit, as well as run more comfortably.    Baseline 8/10 L hamstrings/posterior thigh pain at worst for the past 3 months (04/23/2022)    Time 8    Period Weeks    Status New    Target Date 06/21/22      PT  LONG TERM GOAL #3   Title Pt will improve B hip IR by at least 15 degrees to promote ability to ambulate, run, lift with less back pain.    Baseline Hip PROM -16 degrees R, 17 degrees L (04/23/2022)    Time 8    Period Weeks    Status New    Target Date 06/21/22      PT LONG TERM GOAL #4   Title Pt will improve his lumbar FOTO score by at least 10 points as a demonstration of improved function.    Baseline Lumbar spine FOTO: 25 (04/23/2022)    Time 8    Period Weeks    Status New    Target Date 06/21/22              Plan - 06/06/22 1723     Clinical Impression Statement Continued working on improving posture, lumbar extension, core and glute strength and R hip IR to decrease stress to affected areas. Pt tolerated session well without aggravation of symptoms. No back or L posterior thigh symptoms after session.  Pt will benefit from continued skilled physical therapy services to decrease pain, improve strength and function.    Personal Factors and Comorbidities Time since onset of injury/illness/exacerbation;Profession    Examination-Activity Limitations Bend;Lift    Stability/Clinical Decision Making Stable/Uncomplicated    Rehab Potential Fair    PT Frequency 2x / week    PT Duration 8 weeks    PT Treatment/Interventions Therapeutic exercise;Therapeutic activities;Neuromuscular re-education;Patient/family education;Manual techniques;Dry needling;Spinal Manipulations;Joint Manipulations;Electrical Stimulation;Iontophoresis 4mg /ml Dexamethasone;Traction    PT Next Visit Plan posture, hip IR ROM, trunk and hip strengthening, manual techniques, modalities PRN    PT Home Exercise Plan Medbridge Access Code: [email or web address]    Consulted and Agree with  Plan of Care Patient                   PT, DPT  06/06/2022, 6:05 PM

## 2022-06-07 DIAGNOSIS — J301 Allergic rhinitis due to pollen: Secondary | ICD-10-CM | POA: Diagnosis not present

## 2022-06-11 ENCOUNTER — Ambulatory Visit: Payer: BLUE CROSS/BLUE SHIELD

## 2022-06-11 DIAGNOSIS — M5432 Sciatica, left side: Secondary | ICD-10-CM

## 2022-06-11 DIAGNOSIS — M5459 Other low back pain: Secondary | ICD-10-CM

## 2022-06-11 DIAGNOSIS — M5417 Radiculopathy, lumbosacral region: Secondary | ICD-10-CM

## 2022-06-11 NOTE — Therapy (Signed)
OUTPATIENT PHYSICAL THERAPY TREATMENT NOTE   Patient Name: Curtis Meyer MRN: [id number] DOB:2001/01/18, 21 y.o., male Today's Date: 06/11/2022  PCP: Loura Pardon, MD REFERRING PROVIDER: Loura Pardon, MD   PT End of Session - 06/11/22 1719     Visit Number 9    Number of Visits 17    Date for PT Re-Evaluation 06/21/22    Authorization Type BCBS Other 30 VL    Authorization Time Period 04/23/22-06/21/22    Progress Note Due on Visit 10    PT Start Time 1719    PT Stop Time 1800    PT Time Calculation (min) 41 min    Activity Tolerance Patient tolerated treatment well;No increased pain    Behavior During Therapy WFL for tasks assessed/performed                   Past Medical History:  Diagnosis Date   Asthma    Eczema    Sleep-disordered breathing    Tonsillar and adenoid hypertrophy    SNORING   Past Surgical History:  Procedure Laterality Date   TONSILLECTOMY AND ADENOIDECTOMY N/A 11/16/2015   Procedure: TONSILLECTOMY AND ADENOIDECTOMY;  Surgeon: Bud Face, MD;  Location: [facility];  Service: ENT;  Laterality: N/A;  ADENOIDS CAUTERIZED NO TISSUE SENT   Patient Active Problem List   Diagnosis Date Noted   Chronic low back pain 04/11/2022   Left hamstring injury 04/11/2022   Pain of left lower extremity 04/11/2022   Acute left-sided low back pain with left-sided sciatica 03/01/2022   Mild intermittent asthma without complication 03/01/2022   Intrinsic atopic dermatitis 06/29/2016    REFERRING DIAG: M54.50,G89.29 (ICD-10-CM) - Chronic low back pain, unspecified back pain laterality, unspecified whether sciatica present  M79.605 (ICD-10-CM) - Pain of left lower extremity  THERAPY DIAG:  Other low back pain  Sciatica, left side  Radiculopathy, lumbosacral region  PERTINENT HISTORY: Low back pain, L LE pain. Pain began about 1.5 years ago. Pt was working at [employer]. Lifted a mattress and felt pain in low back. Pain has improved since onset.  Now his hamstrings hurt after playing soccer or running. Pain used to go down his leg (sciatic distribution), currently goes down behind his thigh not past the knee. L hamstrings pain begain about 2 months after back injury, unknown mechanism of onset. No L hamstring swelling or back. No loss of bowel or bladder control. No LE paresthesia. Pt was at work this past Fri at [employer], liftted crate up, bent down and R low back started bothering him like 1.5 years ago but not as bad. No R LE paresthesia.  PRECAUTIONS: No known precautions  SUBJECTIVE: Back is decent, 3/10 low back pain currently, L thigh has no pain.       PAIN:  Are you having pain?  0/10 L posterior thigh and 3/10 low back pain   TODAY'S TREATMENT:  No latex allergies         Therapeutic exercise    Seated manually resisted R lateral shift isometrics, in neutral to correct posture 10x5 seconds for 3 sets  Seated manually resisted L upper trunk rotation to promote more neutral low back posture 10x5 seconds for 3 sets   Seated thoracic extension over chair back 10x5 seconds for 3 sets  Self posterior capsule stretch   R 30 seconds x 3   Seated hip IR   R 10x3  At [employer] machine   Rows plate 35 for 35W6 seconds  Then plate 45 for 39J6 seconds for 2 sets   Sitting with upright posture and B scapular retraction  Manually resisted trunk extension isometrics to help decrease lumbar flexion posture in sitting 10x5 seconds for 3 sets   Static lunge   R 10x2  L 10x2      Improved exercise technique, movement at target joints, use of target muscles after mod verbal, visual, tactile cues.        Response to treatment Pt tolerated session well without aggravation of symptoms.     Clinical impression   Continued working on improving posture, lumbar extension, core and glute strength and R hip IR to decrease stress to affected areas. Pt tolerated session well without aggravation of symptoms. No pain  reported after session. Pt will benefit from continued skilled physical therapy services to decrease pain, improve strength and function.       PATIENT EDUCATION: Education details: ther-ex Person educated: Patient Education method: Explanation, Demonstration, Actor cues, and Verbal cues Education comprehension: verbalized understanding and returned demonstration   HOME EXERCISE PROGRAM: Medbridge Access Code: [email or web address] URL: [email or web address] Date: 04/23/2022 Prepared by: Loralyn Freshwater   Exercises - Lateral Shift Correction at Wall  - 1 x daily - 7 x weekly - 3 sets - 10 reps - 5 seconds hold  - Supine Piriformis Stretch  - 1 x daily - 7 x weekly - 3 sets - 10 reps - 1 minute hold - Supine Bridge  - 1 x daily - 7 x weekly - 3 sets - 10 reps - 5 seconds hold - Static Prone on Elbows  - 3 x daily - 7 x weekly - 1 sets - 2 reps - 2 minutes hold - Prone Hip Extension with Bent Knee  - 1 x daily - 7 x weekly - 3 sets - 5 reps - 5 seconds hold  Sitting with lumbar towel roll x 2 min   PT Short Term Goals - 04/23/22 1751       PT SHORT TERM GOAL #1   Title Pt will be independent with his initial HEP to decrease pain, improve strength, function, and ability to perform work related tasks as well as run more comfortably.    Baseline Pt has started his initial HEP (04/23/2022)    Time 3    Period Weeks    Status New    Target Date 05/17/22              PT Long Term Goals - 04/23/22 1752       PT LONG TERM GOAL #1   Title Pt will have a decrease in low back pain to 3/10 or less at worst to promote ability to lift, sit, as well as run more comfortably.    Baseline 7/10 R low back pain at worst for the past 3 months (04/23/2022)    Time 8    Period Weeks    Status New    Target Date 06/21/22      PT LONG TERM GOAL #2   Title Pt will have a decrease in L LE pain to 4/10 or less at worst to promote ability to lift, sit, as well as run more comfortably.     Baseline 8/10 L hamstrings/posterior thigh pain at worst for the past 3 months (04/23/2022)    Time 8    Period Weeks    Status New    Target Date 06/21/22      PT LONG TERM GOAL #3  Title Pt will improve B hip IR by at least 15 degrees to promote ability to ambulate, run, lift with less back pain.    Baseline Hip PROM -16 degrees R, 17 degrees L (04/23/2022)    Time 8    Period Weeks    Status New    Target Date 06/21/22      PT LONG TERM GOAL #4   Title Pt will improve his lumbar FOTO score by at least 10 points as a demonstration of improved function.    Baseline Lumbar spine FOTO: 25 (04/23/2022)    Time 8    Period Weeks    Status New    Target Date 06/21/22              Plan - 06/11/22 1719     Clinical Impression Statement Continued working on improving posture, lumbar extension, core and glute strength and R hip IR to decrease stress to affected areas. Pt tolerated session well without aggravation of symptoms. No pain reported after session. Pt will benefit from continued skilled physical therapy services to decrease pain, improve strength and function.    Personal Factors and Comorbidities Time since onset of injury/illness/exacerbation;Profession    Examination-Activity Limitations Bend;Lift    Stability/Clinical Decision Making Stable/Uncomplicated    Rehab Potential Fair    PT Frequency 2x / week    PT Duration 8 weeks    PT Treatment/Interventions Therapeutic exercise;Therapeutic activities;Neuromuscular re-education;Patient/family education;Manual techniques;Dry needling;Spinal Manipulations;Joint Manipulations;Electrical Stimulation;Iontophoresis 4mg /ml Dexamethasone;Traction    PT Next Visit Plan posture, hip IR ROM, trunk and hip strengthening, manual techniques, modalities PRN    PT Home Exercise Plan Medbridge Access Code: [email or web address]    Consulted and Agree with Plan of Care Patient                    PT, DPT  06/11/2022, 6:15 PM

## 2022-06-13 ENCOUNTER — Ambulatory Visit: Payer: BLUE CROSS/BLUE SHIELD

## 2022-06-13 DIAGNOSIS — M5459 Other low back pain: Secondary | ICD-10-CM

## 2022-06-13 DIAGNOSIS — M5432 Sciatica, left side: Secondary | ICD-10-CM

## 2022-06-13 DIAGNOSIS — M5417 Radiculopathy, lumbosacral region: Secondary | ICD-10-CM

## 2022-06-13 NOTE — Therapy (Signed)
OUTPATIENT PHYSICAL THERAPY TREATMENT NOTE And Progress Report (04/23/2022 - 06/13/2022)   Patient Name: Curtis Meyer MRN: [id number] DOB:02-10-2001, 21 y.o., male Today's Date: 06/13/2022  PCP: Charlynne Cousins, MD REFERRING PROVIDER: Charlynne Cousins, MD   PT End of Session - 06/13/22 1719     Visit Number 10    Number of Visits 17    Date for PT Re-Evaluation 06/21/22    Authorization Type BCBS Other 30 VL    Authorization Time Period 04/23/22-06/21/22    Progress Note Due on Visit 10    PT Start Time 1719    PT Stop Time 1800    PT Time Calculation (min) 41 min    Activity Tolerance Patient tolerated treatment well;No increased pain    Behavior During Therapy WFL for tasks assessed/performed                    Past Medical History:  Diagnosis Date   Asthma    Eczema    Sleep-disordered breathing    Tonsillar and adenoid hypertrophy    SNORING   Past Surgical History:  Procedure Laterality Date   TONSILLECTOMY AND ADENOIDECTOMY N/A 11/16/2015   Procedure: TONSILLECTOMY AND ADENOIDECTOMY;  Surgeon: Carloyn Manner, MD;  Location: [place];  Service: ENT;  Laterality: N/A;  ADENOIDS CAUTERIZED NO TISSUE SENT   Patient Active Problem List   Diagnosis Date Noted   Chronic low back pain 04/11/2022   Left hamstring injury 04/11/2022   Pain of left lower extremity 04/11/2022   Acute left-sided low back pain with left-sided sciatica 03/01/2022   Mild intermittent asthma without complication 18/56/3149   Intrinsic atopic dermatitis 06/29/2016    REFERRING DIAG: M54.50,G89.29 (ICD-10-CM) - Chronic low back pain, unspecified back pain laterality, unspecified whether sciatica present  M79.605 (ICD-10-CM) - Pain of left lower extremity  THERAPY DIAG:  Other low back pain  Sciatica, left side  Radiculopathy, lumbosacral region  PERTINENT HISTORY: Low back pain, L LE pain. Pain began about 1.5 years ago. Pt was working at [employer]. Lifted a mattress and felt  pain in low back. Pain has improved since onset. Now his hamstrings hurt after playing soccer or running. Pain used to go down his leg (sciatic distribution), currently goes down behind his thigh not past the knee. L hamstrings pain begain about 2 months after back injury, unknown mechanism of onset. No L hamstring swelling or back. No loss of bowel or bladder control. No LE paresthesia. Pt was at work this past Fri at [employer], liftted crate up, bent down and R low back started bothering him like 1.5 years ago but not as bad. No R LE paresthesia.  PRECAUTIONS: No known precautions  SUBJECTIVE: a little bit of back pain currently. No R posterior thigh pain. Took a hard step today with his R foot with increased his back pain.      PAIN:  Are you having pain?  0/10 L posterior thigh and 5/10 low back pain   TODAY'S TREATMENT:  No latex allergies         Therapeutic exercise   Supine hip IR at 90/90 position PROM   Reviewed progress/current status with PT towards goals.   Standing L lateral shift correction 10x10 second holds  Bridge 1x. R low back discomfort  Supine SKTC position  L Hip extension isometrics 10x5 seconds for 2 sets    R hip extension isometrics 10x5 seconds for 2 sets   Slight increased discomfort when performing a bridge  Hooklying hip extension isometrics, leg straight  R 10x5 seconds for 2 sets   No R low back pain with bridge afterwards    Bridge 5x with hip adduction small green ball squeeze  Increased R low back pain   Seated manually resisted R lateral shift isometrics, in neutral to correct posture 10x5 seconds for 3 sets  Sitting with upright posture and B scapular retraction  Manually resisted trunk extension isometrics to help decrease lumbar flexion posture in sitting 10x5 seconds for 2 sets       Improved exercise technique, movement at target joints, use of target muscles after mod verbal, visual, tactile cues.        Response to  treatment Pt tolerated session well without aggravation of symptoms.     Clinical impression  Pt demonstrates decreased low back pain, significant decrease in L posterior thigh pain, improved R hip IR ROM since initial evaluation. Pt making very good progress with PT toward goals. Continued working on improving posture, core and glute strength to decrease stress to affected areas. Pt tolerated session well without aggravation of symptoms. No pain reported after session. Pt will benefit from continued skilled physical therapy services to decrease pain, improve strength and function.       PATIENT EDUCATION: Education details: ther-ex Person educated: Patient Education method: Explanation, Demonstration, Corporate treasurer cues, and Verbal cues Education comprehension: verbalized understanding and returned demonstration   HOME EXERCISE PROGRAM: Medbridge Access Code: [email or web address] URL: [email or web address] Date: 04/23/2022 Prepared by: Joneen Boers   Exercises - Lateral Shift Correction at [place]  - 1 x daily - 7 x weekly - 3 sets - 10 reps - 5 seconds hold  - Supine Piriformis Stretch  - 1 x daily - 7 x weekly - 3 sets - 10 reps - 1 minute hold - Supine Bridge  - 1 x daily - 7 x weekly - 3 sets - 10 reps - 5 seconds hold - Static Prone on Elbows  - 3 x daily - 7 x weekly - 1 sets - 2 reps - 2 minutes hold - Prone Hip Extension with Bent Knee  - 1 x daily - 7 x weekly - 3 sets - 5 reps - 5 seconds hold  Sitting with lumbar towel roll x 2 min   PT Short Term Goals - 06/13/22 1727       PT SHORT TERM GOAL #1   Title Pt will be independent with his initial HEP to decrease pain, improve strength, function, and ability to perform work related tasks as well as run more comfortably.    Baseline Pt has started his initial HEP (04/23/2022); Pt doing his HEP, no questions (06/13/2022)    Time 3    Period Weeks    Status Achieved    Target Date 05/17/22              PT Long Term  Goals - 06/13/22 1721       PT LONG TERM GOAL #1   Title Pt will have a decrease in low back pain to 3/10 or less at worst to promote ability to lift, sit, as well as run more comfortably.    Baseline 7/10 R low back pain at worst for the past 3 months (04/23/2022); 5/10 at worst for the past 7 days ( 06/13/2022)    Time 8    Period Weeks    Status Partially Met    Target Date 06/21/22      PT LONG TERM GOAL #  2   Title Pt will have a decrease in L LE pain to 4/10 or less at worst to promote ability to lift, sit, as well as run more comfortably.    Baseline 8/10 L hamstrings/posterior thigh pain at worst for the past 3 months (04/23/2022); 2/10 at worst for the past 7 days (06/13/2022)    Time 8    Period Weeks    Status Achieved    Target Date 06/21/22      PT LONG TERM GOAL #3   Title Pt will improve B hip IR by at least 15 degrees to promote ability to ambulate, run, lift with less back pain.    Baseline Hip PROM -16 degrees R, 17 degrees L (04/23/2022); 4 degrees R, 15 degrees L (06/13/2022)    Time 8    Period Weeks    Status Partially Met    Target Date 06/21/22      PT LONG TERM GOAL #4   Title Pt will improve his lumbar FOTO score by at least 10 points as a demonstration of improved function.    Baseline Lumbar spine FOTO: 25 (04/23/2022); 63 (06/13/2022)    Time 8    Period Weeks    Status Achieved    Target Date 06/21/22              Plan - 06/13/22 1808     Clinical Impression Statement Pt demonstrates decreased low back pain, significant decrease in L posterior thigh pain, improved R hip IR ROM since initial evaluation. Pt making very good progress with PT toward goals. Continued working on improving posture, core and glute strength to decrease stress to affected areas. Pt tolerated session well without aggravation of symptoms. No pain reported after session. Pt will benefit from continued skilled physical therapy services to decrease pain, improve strength and function.     Personal Factors and Comorbidities Time since onset of injury/illness/exacerbation;Profession    Examination-Activity Limitations Bend;Lift    Stability/Clinical Decision Making Stable/Uncomplicated    Rehab Potential Fair    PT Frequency 2x / week    PT Duration 8 weeks    PT Treatment/Interventions Therapeutic exercise;Therapeutic activities;Neuromuscular re-education;Patient/family education;Manual techniques;Dry needling;Spinal Manipulations;Joint Manipulations;Electrical Stimulation;Iontophoresis 64m/ml Dexamethasone;Traction    PT Next Visit Plan posture, hip IR ROM, trunk and hip strengthening, manual techniques, modalities PRN    PT Home Exercise Plan Medbridge Access Code: [email or web address]    Consulted and Agree with Plan of Care Patient                   Thank you for your referral.   MJoneen BoersPT, DPT  06/13/2022, 6:12 PM

## 2022-06-18 ENCOUNTER — Ambulatory Visit: Payer: BLUE CROSS/BLUE SHIELD

## 2022-06-20 ENCOUNTER — Ambulatory Visit: Payer: BLUE CROSS/BLUE SHIELD

## 2022-06-20 DIAGNOSIS — M5417 Radiculopathy, lumbosacral region: Secondary | ICD-10-CM

## 2022-06-20 DIAGNOSIS — M5432 Sciatica, left side: Secondary | ICD-10-CM

## 2022-06-20 DIAGNOSIS — M5459 Other low back pain: Secondary | ICD-10-CM | POA: Diagnosis not present

## 2022-06-20 NOTE — Therapy (Signed)
OUTPATIENT PHYSICAL THERAPY TREATMENT NOTE    Patient Name: Curtis Meyer MRN: [id number] DOB:[birth year], 21 y.o., male Today's Date: 06/20/2022  PCP: Charlynne Cousins, MD REFERRING PROVIDER: Charlynne Cousins, MD   PT End of Session - 06/20/22 1720     Visit Number 11    Number of Visits 17    Date for PT Re-Evaluation 06/21/22    Authorization Type BCBS Other 30 VL    Authorization Time Period 04/23/22-06/21/22    Progress Note Due on Visit 10    PT Start Time 1720    PT Stop Time 1751    PT Time Calculation (min) 31 min    Activity Tolerance Patient tolerated treatment well;No increased pain    Behavior During Therapy WFL for tasks assessed/performed                     Past Medical History:  Diagnosis Date   Asthma    Eczema    Sleep-disordered breathing    Tonsillar and adenoid hypertrophy    SNORING   Past Surgical History:  Procedure Laterality Date   TONSILLECTOMY AND ADENOIDECTOMY N/A 11/16/2015   Procedure: TONSILLECTOMY AND ADENOIDECTOMY;  Surgeon: Carloyn Manner, MD;  Location: [place];  Service: ENT;  Laterality: N/A;  ADENOIDS CAUTERIZED NO TISSUE SENT   Patient Active Problem List   Diagnosis Date Noted   Chronic low back pain 04/11/2022   Left hamstring injury 04/11/2022   Pain of left lower extremity 04/11/2022   Acute left-sided low back pain with left-sided sciatica 03/01/2022   Mild intermittent asthma without complication 65/99/3570   Intrinsic atopic dermatitis 06/29/2016    REFERRING DIAG: M54.50,G89.29 (ICD-10-CM) - Chronic low back pain, unspecified back pain laterality, unspecified whether sciatica present  M79.605 (ICD-10-CM) - Pain of left lower extremity  THERAPY DIAG:  Other low back pain  Sciatica, left side  Radiculopathy, lumbosacral region  PERTINENT HISTORY: Low back pain, L LE pain. Pain began about 1.5 years ago. Pt was working at [employer]. Lifted a mattress and felt pain in low back. Pain has improved since  onset. Now his hamstrings hurt after playing soccer or running. Pain used to go down his leg (sciatic distribution), currently goes down behind his thigh not past the knee. L hamstrings pain begain about 2 months after back injury, unknown mechanism of onset. No L hamstring swelling or back. No loss of bowel or bladder control. No LE paresthesia. Pt was at work this past Fri at [employer], liftted crate up, bent down and R low back started bothering him like 1.5 years ago but not as bad. No R LE paresthesia.  PRECAUTIONS: No known precautions  SUBJECTIVE: No pain in R hamstrings. 4/10 low back pain currently.     PAIN:  Are you having pain?  0/10 L posterior thigh and 5/10 low back pain   TODAY'S TREATMENT:  No latex allergies         Therapeutic exercise   Seated manually resisted R lateral shift isometrics, in neutral to correct posture 10x5 seconds for 3 sets  Sitting with upright posture and B scapular retraction  Manually resisted trunk extension isometrics to help decrease lumbar flexion posture in sitting 10x5 seconds for 2 sets   Standing lumbar extension 10x5 seconds for 3 sets  OMEGA machine   Rows plate 35 for 17B9 seconds    Then plate 45 for 39Q3 seconds for 2 sets  Static lunge 10x3 each LE  Omega machine  Lat pull  downs plate 65 for 16X      Improved exercise technique, movement at target joints, use of target muscles after mod verbal, visual, tactile cues.        Response to treatment Pt tolerated session well without aggravation of symptoms.     Clinical impression  Continued working on lumbar extension secondary to directional preference, improving posture, trunk and hip strength to decrease stress to affected areas. Pt making very good progress with improvement in L posterior thigh pain based on subjective reports. No pain reported after session. Pt will benefit from continued skilled physical therapy services to decrease pain, improve strength and  function.       PATIENT EDUCATION: Education details: ther-ex Person educated: Patient Education method: Explanation, Demonstration, Corporate treasurer cues, and Verbal cues Education comprehension: verbalized understanding and returned demonstration   HOME EXERCISE PROGRAM: Medbridge Access Code: [email or web address] URL: [email or web address] Date: 04/23/2022 Prepared by: Joneen Boers   Exercises - Lateral Shift Correction at [place]  - 1 x daily - 7 x weekly - 3 sets - 10 reps - 5 seconds hold  - Supine Piriformis Stretch  - 1 x daily - 7 x weekly - 3 sets - 10 reps - 1 minute hold - Supine Bridge  - 1 x daily - 7 x weekly - 3 sets - 10 reps - 5 seconds hold - Static Prone on Elbows  - 3 x daily - 7 x weekly - 1 sets - 2 reps - 2 minutes hold - Prone Hip Extension with Bent Knee  - 1 x daily - 7 x weekly - 3 sets - 5 reps - 5 seconds hold  Sitting with lumbar towel roll x 2 min   PT Short Term Goals - 06/13/22 1727       PT SHORT TERM GOAL #1   Title Pt will be independent with his initial HEP to decrease pain, improve strength, function, and ability to perform work related tasks as well as run more comfortably.    Baseline Pt has started his initial HEP (04/23/2022); Pt doing his HEP, no questions (06/13/2022)    Time 3    Period Weeks    Status Achieved    Target Date 05/17/22              PT Long Term Goals - 06/13/22 1721       PT LONG TERM GOAL #1   Title Pt will have a decrease in low back pain to 3/10 or less at worst to promote ability to lift, sit, as well as run more comfortably.    Baseline 7/10 R low back pain at worst for the past 3 months (04/23/2022); 5/10 at worst for the past 7 days ( 06/13/2022)    Time 8    Period Weeks    Status Partially Met    Target Date 06/21/22      PT LONG TERM GOAL #2   Title Pt will have a decrease in L LE pain to 4/10 or less at worst to promote ability to lift, sit, as well as run more comfortably.    Baseline 8/10 L  hamstrings/posterior thigh pain at worst for the past 3 months (04/23/2022); 2/10 at worst for the past 7 days (06/13/2022)    Time 8    Period Weeks    Status Achieved    Target Date 06/21/22      PT LONG TERM GOAL #3   Title Pt will improve B hip IR by at least  15 degrees to promote ability to ambulate, run, lift with less back pain.    Baseline Hip PROM -16 degrees R, 17 degrees L (04/23/2022); 4 degrees R, 15 degrees L (06/13/2022)    Time 8    Period Weeks    Status Partially Met    Target Date 06/21/22      PT LONG TERM GOAL #4   Title Pt will improve his lumbar FOTO score by at least 10 points as a demonstration of improved function.    Baseline Lumbar spine FOTO: 25 (04/23/2022); 63 (06/13/2022)    Time 8    Period Weeks    Status Achieved    Target Date 06/21/22              Plan - 06/20/22 1759     Clinical Impression Statement Continued working on lumbar extension secondary to directional preference, improving posture, trunk and hip strength to decrease stress to affected areas. Pt making very good progress with improvement in L posterior thigh pain based on subjective reports. No pain reported after session. Pt will benefit from continued skilled physical therapy services to decrease pain, improve strength and function.    Personal Factors and Comorbidities Time since onset of injury/illness/exacerbation;Profession    Examination-Activity Limitations Bend;Lift    Stability/Clinical Decision Making Stable/Uncomplicated    Clinical Decision Making Low    Rehab Potential Fair    PT Frequency 2x / week    PT Duration 8 weeks    PT Treatment/Interventions Therapeutic exercise;Therapeutic activities;Neuromuscular re-education;Patient/family education;Manual techniques;Dry needling;Spinal Manipulations;Joint Manipulations;Electrical Stimulation;Iontophoresis 4mg /ml Dexamethasone;Traction    PT Next Visit Plan posture, hip IR ROM, trunk and hip strengthening, manual techniques,  modalities PRN    PT Home Exercise Plan Medbridge Access Code: [email or web address]    Consulted and Agree with Plan of Care Patient                    Joneen Boers PT, DPT  06/20/2022, 6:00 PM

## 2022-06-25 ENCOUNTER — Ambulatory Visit: Payer: BLUE CROSS/BLUE SHIELD | Attending: Internal Medicine

## 2022-06-25 DIAGNOSIS — M5417 Radiculopathy, lumbosacral region: Secondary | ICD-10-CM | POA: Insufficient documentation

## 2022-06-25 DIAGNOSIS — M5432 Sciatica, left side: Secondary | ICD-10-CM | POA: Insufficient documentation

## 2022-06-25 DIAGNOSIS — M5459 Other low back pain: Secondary | ICD-10-CM | POA: Insufficient documentation

## 2022-06-25 NOTE — Therapy (Addendum)
OUTPATIENT PHYSICAL THERAPY TREATMENT NOTE    Patient Name: Curtis Meyer MRN: [id number] DOB:30-Aug-2001, 21 y.o., male Today's Date: 06/25/2022  PCP: Charlynne Cousins, MD REFERRING PROVIDER: Charlynne Cousins, MD/Cannady, Barbaraann Faster, NP   PT End of Session - 06/25/22 1718     Visit Number 12    Number of Visits 29    Date for PT Re-Evaluation 08/09/22    Authorization Type BCBS Other 30 VL    Authorization Time Period 04/23/22-06/21/22    Progress Note Due on Visit 10    PT Start Time 1718    PT Stop Time 1758    PT Time Calculation (min) 40 min    Activity Tolerance Patient tolerated treatment well;No increased pain    Behavior During Therapy WFL for tasks assessed/performed                      Past Medical History:  Diagnosis Date   Asthma    Eczema    Sleep-disordered breathing    Tonsillar and adenoid hypertrophy    SNORING   Past Surgical History:  Procedure Laterality Date   TONSILLECTOMY AND ADENOIDECTOMY N/A 11/16/2015   Procedure: TONSILLECTOMY AND ADENOIDECTOMY;  Surgeon: Carloyn Manner, MD;  Location: [place];  Service: ENT;  Laterality: N/A;  ADENOIDS CAUTERIZED NO TISSUE SENT   Patient Active Problem List   Diagnosis Date Noted   Chronic low back pain 04/11/2022   Left hamstring injury 04/11/2022   Pain of left lower extremity 04/11/2022   Acute left-sided low back pain with left-sided sciatica 03/01/2022   Mild intermittent asthma without complication 09/15/3006   Intrinsic atopic dermatitis 06/29/2016    REFERRING DIAG: M54.50,G89.29 (ICD-10-CM) - Chronic low back pain, unspecified back pain laterality, unspecified whether sciatica present  M79.605 (ICD-10-CM) - Pain of left lower extremity  THERAPY DIAG:  Other low back pain  Sciatica, left side  Radiculopathy, lumbosacral region  PERTINENT HISTORY: Low back pain, L LE pain. Pain began about 1.5 years ago. Pt was working at [employer]. Lifted a mattress and felt pain in low back.  Pain has improved since onset. Now his hamstrings hurt after playing soccer or running. Pain used to go down his leg (sciatic distribution), currently goes down behind his thigh not past the knee. L hamstrings pain begain about 2 months after back injury, unknown mechanism of onset. No L hamstring swelling or back. No loss of bowel or bladder control. No LE paresthesia. Pt was at work this past Fri at [employer], liftted crate up, bent down and R low back started bothering him like 1.5 years ago but not as bad. No R LE paresthesia.  PRECAUTIONS: No known precautions  SUBJECTIVE: Has had no pain in L hamstrings. 2/10 low back pain currently, 3-4/10 low back pain at most for the past 7 days. Feels like he might need a little bit more PT.     PAIN:  Are you having pain?  0/10 L posterior thigh and 2/10 low back pain   TODAY'S TREATMENT:  No latex allergies         Therapeutic exercise   Reviewed POC: continue another 2x/week for 6 weeks.   Supine hip IR PROM R and L at 90/90 position  Reviewed progress with PT towards goals   Seated manually resisted R lateral shift isometrics, in neutral to correct posture 10x5 seconds for 3 sets  Sitting with upright posture and B scapular retraction  Manually resisted trunk extension isometrics to help  decrease lumbar flexion posture in sitting 10x5 seconds for 2 sets   Static lunge 10x3 each LE  Standing lumbar extension 10x10 seconds  OMEGA machine   Rows plate 45 for 10X3 seconds for 2 sets     Improved exercise technique, movement at target joints, use of target muscles after mod verbal, visual, tactile cues.        Response to treatment No back pain reported after session.      Clinical impression  Pt demonstrates decreased low back and L LE pain, improved B hip IR ROM, and overall function since initial evaluation. Pt making progress with PT towards goals. Continued working on lumbar extension secondary to directional preference,  improving posture, trunk and hip strength to decrease stress to affected areas. No back pain reported after session. Pt will benefit from continued skilled physical therapy services to decrease pain, improve strength and function and per pt request.       PATIENT EDUCATION: Education details: ther-ex Person educated: Patient Education method: Explanation, Demonstration, Tactile cues, and Verbal cues Education comprehension: verbalized understanding and returned demonstration   HOME EXERCISE PROGRAM: Medbridge Access Code: [email or web address] URL: [email or web address] Date: 04/23/2022 Prepared by: Joneen Boers   Exercises - Lateral Shift Correction at [place]  - 1 x daily - 7 x weekly - 3 sets - 10 reps - 5 seconds hold  - Supine Piriformis Stretch  - 1 x daily - 7 x weekly - 3 sets - 10 reps - 1 minute hold - Supine Bridge  - 1 x daily - 7 x weekly - 3 sets - 10 reps - 5 seconds hold - Static Prone on Elbows  - 3 x daily - 7 x weekly - 1 sets - 2 reps - 2 minutes hold - Prone Hip Extension with Bent Knee  - 1 x daily - 7 x weekly - 3 sets - 5 reps - 5 seconds hold  Sitting with lumbar towel roll x 2 min   PT Short Term Goals - 06/13/22 1727       PT SHORT TERM GOAL #1   Title Pt will be independent with his initial HEP to decrease pain, improve strength, function, and ability to perform work related tasks as well as run more comfortably.    Baseline Pt has started his initial HEP (04/23/2022); Pt doing his HEP, no questions (06/13/2022)    Time 3    Period Weeks    Status Achieved    Target Date 05/17/22              PT Long Term Goals - 06/25/22 1723       PT LONG TERM GOAL #1   Title Pt will have a decrease in low back pain to 3/10 or less at worst to promote ability to lift, sit, as well as run more comfortably.    Baseline 7/10 R low back pain at worst for the past 3 months (04/23/2022); 5/10 at worst for the past 7 days ( 06/13/2022); 3-4/10 (06/25/2022)    Time 6     Period Weeks    Status Partially Met    Target Date 08/09/22      PT LONG TERM GOAL #2   Title Pt will have a decrease in L LE pain to 4/10 or less at worst to promote ability to lift, sit, as well as run more comfortably.    Baseline 8/10 L hamstrings/posterior thigh pain at worst for the past 3 months (04/23/2022); 2/10 at worst  for the past 7 days (06/13/2022); 0/10 at most for the past 7 days (06/25/2022)    Time 8    Period Weeks    Status Achieved    Target Date 06/21/22      PT LONG TERM GOAL #3   Title Pt will improve B hip IR by at least 15 degrees to promote ability to ambulate, run, lift with less back pain.    Baseline Hip PROM -16 degrees R, 17 degrees L (04/23/2022); 4 degrees R, 15 degrees L (06/13/2022); 22 degrees R,  28 degrees L    Time 6    Period Weeks    Status Partially Met    Target Date 08/09/22      PT LONG TERM GOAL #4   Title Pt will improve his lumbar FOTO score by at least 10 points as a demonstration of improved function.    Baseline Lumbar spine FOTO: 25 (04/23/2022); 63 (06/13/2022)    Time 8    Period Weeks    Status Achieved    Target Date 06/21/22              Plan - 06/25/22 1718     Clinical Impression Statement Pt demonstrates decreased low back and L LE pain, improved B hip IR ROM, and overall function since initial evaluation. Pt making progress with PT towards goals. Continued working on lumbar extension secondary to directional preference, improving posture, trunk and hip strength to decrease stress to affected areas. No back pain reported after session. Pt will benefit from continued skilled physical therapy services to decrease pain, improve strength and function and per pt request.    Personal Factors and Comorbidities Time since onset of injury/illness/exacerbation;Profession    Examination-Activity Limitations Bend;Lift    Stability/Clinical Decision Making Stable/Uncomplicated    Clinical Decision Making Low    Rehab Potential Fair     PT Frequency 2x / week    PT Duration 6 weeks    PT Treatment/Interventions Therapeutic exercise;Therapeutic activities;Neuromuscular re-education;Patient/family education;Manual techniques;Dry needling;Spinal Manipulations;Joint Manipulations;Electrical Stimulation;Iontophoresis 4mg /ml Dexamethasone;Traction    PT Next Visit Plan posture, hip IR ROM, trunk and hip strengthening, manual techniques, modalities PRN    PT Home Exercise Plan Medbridge Access Code: [email or web address]    Consulted and Agree with Plan of Care Patient                     Joneen Boers PT, DPT  06/25/2022, 6:04 PM

## 2022-06-25 NOTE — Addendum Note (Signed)
Addended by: Charlene Brooke on: 06/25/2022 06:11 PM   Modules accepted: Orders

## 2022-06-27 ENCOUNTER — Ambulatory Visit: Payer: BLUE CROSS/BLUE SHIELD

## 2022-06-27 DIAGNOSIS — M5459 Other low back pain: Secondary | ICD-10-CM

## 2022-06-27 DIAGNOSIS — M5417 Radiculopathy, lumbosacral region: Secondary | ICD-10-CM

## 2022-06-27 DIAGNOSIS — M5432 Sciatica, left side: Secondary | ICD-10-CM

## 2022-06-27 NOTE — Therapy (Signed)
OUTPATIENT PHYSICAL THERAPY TREATMENT NOTE    Patient Name: Curtis Meyer MRN: [id number] DOB:08/07/2001, 21 y.o., male Today's Date: 06/27/2022  PCP: Charlynne Cousins, MD REFERRING PROVIDER: Charlynne Cousins, MD/Cannady, Barbaraann Faster, NP   PT End of Session - 06/27/22 1719     Visit Number 13    Number of Visits 29    Date for PT Re-Evaluation 08/09/22    Authorization Type BCBS Other 30 VL    Authorization Time Period 04/23/22-06/21/22    Progress Note Due on Visit 10    PT Start Time 4081    PT Stop Time 1758    PT Time Calculation (min) 39 min    Activity Tolerance Patient tolerated treatment well;No increased pain    Behavior During Therapy WFL for tasks assessed/performed                       Past Medical History:  Diagnosis Date   Asthma    Eczema    Sleep-disordered breathing    Tonsillar and adenoid hypertrophy    SNORING   Past Surgical History:  Procedure Laterality Date   TONSILLECTOMY AND ADENOIDECTOMY N/A 11/16/2015   Procedure: TONSILLECTOMY AND ADENOIDECTOMY;  Surgeon: Carloyn Manner, MD;  Location: [place] [facility];  Service: ENT;  Laterality: N/A;  ADENOIDS CAUTERIZED NO TISSUE SENT   Patient Active Problem List   Diagnosis Date Noted   Chronic low back pain 04/11/2022   Left hamstring injury 04/11/2022   Pain of left lower extremity 04/11/2022   Acute left-sided low back pain with left-sided sciatica 03/01/2022   Mild intermittent asthma without complication 44/81/8563   Intrinsic atopic dermatitis 06/29/2016    REFERRING DIAG: M54.50,G89.29 (ICD-10-CM) - Chronic low back pain, unspecified back pain laterality, unspecified whether sciatica present  M79.605 (ICD-10-CM) - Pain of left lower extremity  THERAPY DIAG:  Other low back pain  Sciatica, left side  Radiculopathy, lumbosacral region  PERTINENT HISTORY: Low back pain, L LE pain. Pain began about 1.5 years ago. Pt was working at [employer]. Lifted a mattress and felt pain in low back.  Pain has improved since onset. Now his hamstrings hurt after playing soccer or running. Pain used to go down his leg (sciatic distribution), currently goes down behind his thigh not past the knee. L hamstrings pain begain about 2 months after back injury, unknown mechanism of onset. No L hamstring swelling or back. No loss of bowel or bladder control. No LE paresthesia. Pt was at work this past Fri at [employer], liftted crate up, bent down and R low back started bothering him like 1.5 years ago but not as bad. No R LE paresthesia.  PRECAUTIONS: No known precautions  SUBJECTIVE: Back is doing pretty good. L hamstring is doing good.     PAIN:  Are you having pain?  0/10 L posterior thigh and 0/10 low back pain   TODAY'S TREATMENT:  No latex allergies         Therapeutic exercise    Seated manually resisted R lateral shift isometrics, in neutral to correct posture 10x5 seconds for 3 sets  Sitting with upright posture and B scapular retraction  Manually resisted trunk extension isometrics to help decrease lumbar flexion posture in sitting 10x5 seconds for 3 sets   Ergonomic lifting   50 lbs 10x   Then bringing load to the table on the R 5x  Then bringing the load to the table on the L 5x   Standing lumbar extension 10x  5-10 seconds   OMEGA machine   Rows plate 45 for 22L7 seconds for 2 sets  Static lunge 10x3 each LE  Omega machine      Lat pull downs plate 45 for 98X2      Improved exercise technique, movement at target joints, use of target muscles after mod verbal, visual, tactile cues.        Response to treatment Pt tolerated session well without aggravation of symptoms.      Clinical impression  Decreasing low back pain based on subjective reports with 0/10 starting low back pain today. Continued working on lumbar extension secondary to directional preference, improving posture, trunk and hip strength to decrease stress to affected areas. Pt tolerated session  well without aggravation of symptoms. Pt will benefit from continued skilled physical therapy services to decrease pain, improve strength and function and per pt request.       PATIENT EDUCATION: Education details: ther-ex Person educated: Patient Education method: Explanation, Demonstration, Tactile cues, and Verbal cues Education comprehension: verbalized understanding and returned demonstration   HOME EXERCISE PROGRAM: Medbridge Access Code: [email or web address] URL: [email or web address] Date: 04/23/2022 Prepared by: Joneen Boers   Exercises - Lateral Shift Correction at [place]  - 1 x daily - 7 x weekly - 3 sets - 10 reps - 5 seconds hold  - Supine Piriformis Stretch  - 1 x daily - 7 x weekly - 3 sets - 10 reps - 1 minute hold - Supine Bridge  - 1 x daily - 7 x weekly - 3 sets - 10 reps - 5 seconds hold - Static Prone on Elbows  - 3 x daily - 7 x weekly - 1 sets - 2 reps - 2 minutes hold - Prone Hip Extension with Bent Knee  - 1 x daily - 7 x weekly - 3 sets - 5 reps - 5 seconds hold  Sitting with lumbar towel roll x 2 min   PT Short Term Goals - 06/13/22 1727       PT SHORT TERM GOAL #1   Title Pt will be independent with his initial HEP to decrease pain, improve strength, function, and ability to perform work related tasks as well as run more comfortably.    Baseline Pt has started his initial HEP (04/23/2022); Pt doing his HEP, no questions (06/13/2022)    Time 3    Period Weeks    Status Achieved    Target Date 05/17/22              PT Long Term Goals - 06/25/22 1723       PT LONG TERM GOAL #1   Title Pt will have a decrease in low back pain to 3/10 or less at worst to promote ability to lift, sit, as well as run more comfortably.    Baseline 7/10 R low back pain at worst for the past 3 months (04/23/2022); 5/10 at worst for the past 7 days ( 06/13/2022); 3-4/10 (06/25/2022)    Time 6    Period Weeks    Status Partially Met    Target Date 08/09/22      PT LONG  TERM GOAL #2   Title Pt will have a decrease in L LE pain to 4/10 or less at worst to promote ability to lift, sit, as well as run more comfortably.    Baseline 8/10 L hamstrings/posterior thigh pain at worst for the past 3 months (04/23/2022); 2/10 at worst for the past 7 days (06/13/2022); 0/10  at most for the past 7 days (06/25/2022)    Time 8    Period Weeks    Status Achieved    Target Date 06/21/22      PT LONG TERM GOAL #3   Title Pt will improve B hip IR by at least 15 degrees to promote ability to ambulate, run, lift with less back pain.    Baseline Hip PROM -16 degrees R, 17 degrees L (04/23/2022); 4 degrees R, 15 degrees L (06/13/2022); 22 degrees R,  28 degrees L    Time 6    Period Weeks    Status Partially Met    Target Date 08/09/22      PT LONG TERM GOAL #4   Title Pt will improve his lumbar FOTO score by at least 10 points as a demonstration of improved function.    Baseline Lumbar spine FOTO: 25 (04/23/2022); 63 (06/13/2022)    Time 8    Period Weeks    Status Achieved    Target Date 06/21/22              Plan - 06/27/22 1717     Clinical Impression Statement Decreasing low back pain based on subjective reports with 0/10 starting low back pain today. Continued working on lumbar extension secondary to directional preference, improving posture, trunk and hip strength to decrease stress to affected areas. Pt tolerated session well without aggravation of symptoms. Pt will benefit from continued skilled physical therapy services to decrease pain, improve strength and function and per pt request.    Personal Factors and Comorbidities Time since onset of injury/illness/exacerbation;Profession    Examination-Activity Limitations Bend;Lift    Stability/Clinical Decision Making Stable/Uncomplicated    Clinical Decision Making Low    Rehab Potential Fair    PT Frequency 2x / week    PT Duration 6 weeks    PT Treatment/Interventions Therapeutic exercise;Therapeutic  activities;Neuromuscular re-education;Patient/family education;Manual techniques;Dry needling;Spinal Manipulations;Joint Manipulations;Electrical Stimulation;Iontophoresis 28m/ml Dexamethasone;Traction    PT Next Visit Plan posture, hip IR ROM, trunk and hip strengthening, manual techniques, modalities PRN    PT Home Exercise Plan Medbridge Access Code: [email or web address]    Consulted and Agree with Plan of Care Patient                      MJoneen BoersPT, DPT  06/27/2022, 6:07 PM

## 2022-06-27 NOTE — Addendum Note (Signed)
Addended by: Charlene Brooke on: 06/27/2022 04:17 PM   Modules accepted: Orders

## 2022-06-28 DIAGNOSIS — J301 Allergic rhinitis due to pollen: Secondary | ICD-10-CM | POA: Diagnosis not present

## 2022-07-25 ENCOUNTER — Ambulatory Visit: Payer: BLUE CROSS/BLUE SHIELD | Attending: Nurse Practitioner

## 2022-07-25 ENCOUNTER — Telehealth: Payer: Self-pay

## 2022-07-25 DIAGNOSIS — M5432 Sciatica, left side: Secondary | ICD-10-CM | POA: Insufficient documentation

## 2022-07-25 DIAGNOSIS — M5459 Other low back pain: Secondary | ICD-10-CM | POA: Insufficient documentation

## 2022-07-25 DIAGNOSIS — M5417 Radiculopathy, lumbosacral region: Secondary | ICD-10-CM | POA: Insufficient documentation

## 2022-07-25 NOTE — Telephone Encounter (Signed)
No show. Called patient and left a message pertaining to appointment and a reminder for the next follow up session. Return phone call requested. Phone number ([phone]) provided.   

## 2022-07-26 DIAGNOSIS — J301 Allergic rhinitis due to pollen: Secondary | ICD-10-CM | POA: Diagnosis not present

## 2022-08-02 DIAGNOSIS — J301 Allergic rhinitis due to pollen: Secondary | ICD-10-CM | POA: Diagnosis not present

## 2022-08-06 ENCOUNTER — Ambulatory Visit: Payer: BLUE CROSS/BLUE SHIELD

## 2022-08-08 ENCOUNTER — Telehealth: Payer: Self-pay

## 2022-08-08 ENCOUNTER — Ambulatory Visit: Payer: BLUE CROSS/BLUE SHIELD

## 2022-08-08 NOTE — Telephone Encounter (Signed)
Called patient to inform him of missed appointment yesterday and informed of next appointment for later today at 5:15 p.m. Encouraged patient to call and update clinic on PT needs to continue care or to discharge as patient has had three no shows leading to discharge from clinic per policy.

## 2022-08-13 ENCOUNTER — Ambulatory Visit: Payer: BLUE CROSS/BLUE SHIELD

## 2022-08-13 DIAGNOSIS — M5432 Sciatica, left side: Secondary | ICD-10-CM

## 2022-08-13 DIAGNOSIS — M5459 Other low back pain: Secondary | ICD-10-CM | POA: Diagnosis present

## 2022-08-13 DIAGNOSIS — M5417 Radiculopathy, lumbosacral region: Secondary | ICD-10-CM | POA: Diagnosis present

## 2022-08-13 DIAGNOSIS — J301 Allergic rhinitis due to pollen: Secondary | ICD-10-CM | POA: Diagnosis not present

## 2022-08-13 NOTE — Therapy (Signed)
OUTPATIENT PHYSICAL THERAPY TREATMENT NOTE    Patient Name: Curtis Meyer MRN: [id number] DOB:07/06/2001, 21 y.o., male Today's Date: 08/13/2022  PCP: Venita Lick, NP  REFERRING PROVIDER: Venita Lick, NP   PT End of Session - 08/13/22 1718     Visit Number 14    Number of Visits 29    Date for PT Re-Evaluation 08/23/22    Authorization Type BCBS Other 30 VL    Authorization Time Period 04/23/22-06/21/22    Progress Note Due on Visit 10    PT Start Time 1718    PT Stop Time 1800    PT Time Calculation (min) 42 min    Activity Tolerance Patient tolerated treatment well;No increased pain    Behavior During Therapy WFL for tasks assessed/performed                        Past Medical History:  Diagnosis Date   Asthma    Eczema    Sleep-disordered breathing    Tonsillar and adenoid hypertrophy    SNORING   Past Surgical History:  Procedure Laterality Date   TONSILLECTOMY AND ADENOIDECTOMY N/A 11/16/2015   Procedure: TONSILLECTOMY AND ADENOIDECTOMY;  Surgeon: Carloyn Manner, MD;  Location: [place];  Service: ENT;  Laterality: N/A;  ADENOIDS CAUTERIZED NO TISSUE SENT   Patient Active Problem List   Diagnosis Date Noted   Chronic low back pain 04/11/2022   Left hamstring injury 04/11/2022   Pain of left lower extremity 04/11/2022   Acute left-sided low back pain with left-sided sciatica 03/01/2022   Mild intermittent asthma without complication 85/01/7740   Intrinsic atopic dermatitis 06/29/2016    REFERRING DIAG: M54.50,G89.29 (ICD-10-CM) - Chronic low back pain, unspecified back pain laterality, unspecified whether sciatica present  M79.605 (ICD-10-CM) - Pain of left lower extremity  THERAPY DIAG:  Other low back pain - Plan: PT plan of care cert/re-cert  Sciatica, left side - Plan: PT plan of care cert/re-cert  Radiculopathy, lumbosacral region - Plan: PT plan of care cert/re-cert  PERTINENT HISTORY: Low back pain, L LE  pain. Pain began about 1.5 years ago. Pt was working at [employer]. Lifted a mattress and felt pain in low back. Pain has improved since onset. Now his hamstrings hurt after playing soccer or running. Pain used to go down his leg (sciatic distribution), currently goes down behind his thigh not past the knee. L hamstrings pain begain about 2 months after back injury, unknown mechanism of onset. No L hamstring swelling or back. No loss of bowel or bladder control. No LE paresthesia. Pt was at work this past Fri at [employer], liftted crate up, bent down and R low back started bothering him like 1.5 years ago but not as bad. No R LE paresthesia.  PRECAUTIONS: No known precautions  SUBJECTIVE: Has been doing ok. L hamstrings is fine. Forgets to do his exercises at home. Last time he did his HEP was last Wednesday.  The main problem he has been having is sitting down about 30 minutes and wrapping the pallet at work (has to get low and the position is awkward).     PAIN:  Are you having pain?  3/10 low back pain   TODAY'S TREATMENT:  No latex allergies         Therapeutic exercise   Standing L lateral shift correction at wall 5x10 seconds for 2 sets  Supine hip IR PROM at 90/90 degrees.   Reviewed plan of  care: continue another 2x/week for 2 weeks.    Standing squat: limited hip and knee flexion  Then with heels elevated: pt able to go lower suggesting tight gastroc/soleus muscles  Standing gastroc then soleus stretch   R 30 seconds x 3 each   L 30 seconds x 3 each   Quadruped self posterior hip capsule stretch   R 30 seconds x 3  L 30 seconds x 3   Standing gastroc stretch at stair step with B UE assist 30 seconds x 3  Side stepping while is squat position 32 ft to the R and 32 ft to the L, cues for straight back.   Forward cowboy walk while in squat position, emphasis on straight back 32 ft.          Improved exercise technique, movement at target joints, use of target muscles  after min to mod verbal, visual, tactile cues.        Response to treatment Pt tolerated session well without aggravation of symptoms. No back pain reported after session.      Clinical impression   Pt demonstrates overall improved L LE and low back pain as well as function since initial evaluation. Slight increase in worst low back pain however compared to previous measurement in which pt not performing his HEP for the past week might play a factor. Pt also reports increased low back pain when sitting for 30 minutes as well as when wrapping the pallets at work (pt has to go low towards the ground to wrap plastic around the pallet). 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 No back pain reported after session. Pt continues to have no L LE pain based on subjective reports. Pt will benefit from continued skilled physical therapy services to decrease pain, improve strength and function.       PATIENT EDUCATION: Education details: ther-ex Person educated: Patient Education method: Explanation, Demonstration, Actor cues, and Verbal cues Education comprehension: verbalized understanding and returned demonstration   HOME EXERCISE PROGRAM: Medbridge Access Code: [email or web address] URL: [email or web address] Date: 04/23/2022 Prepared by: Loralyn Freshwater   Exercises - Lateral Shift Correction at Wall  - 1 x daily - 7 x weekly - 3 sets - 10 reps - 5 seconds hold  - Supine Piriformis Stretch  - 1 x daily - 7 x weekly - 3 sets - 10 reps - 1 minute hold - Supine Bridge  - 1 x daily - 7 x weekly - 3 sets - 10 reps - 5 seconds hold - Static Prone on Elbows  - 3 x daily - 7 x weekly - 1 sets - 2 reps - 2 minutes hold - Prone Hip Extension with Bent Knee  - 1 x daily - 7 x weekly - 3 sets - 5 reps - 5 seconds  hold  Sitting with lumbar towel roll x 2 min  - Soleus Stretch on Wall  - 3 x daily - 7 x weekly - 1 sets - 3 reps - 30 seconds hold - Gastroc Stretch on Wall  - 3 x daily - 7 x weekly - 1 sets - 3 reps - 30 seconds  hold  Quadruped self posterior hip capsule stretch   R 30 seconds x 3  L 30 seconds x 3     PT Short Term Goals - 06/13/22 1727  PT SHORT TERM GOAL #1   Title Pt will be independent with his initial HEP to decrease pain, improve strength, function, and ability to perform work related tasks as well as run more comfortably.    Baseline Pt has started his initial HEP (04/23/2022); Pt doing his HEP, no questions (06/13/2022)    Time 3    Period Weeks    Status Achieved    Target Date 05/17/22              PT Long Term Goals - 08/13/22 1720       PT LONG TERM GOAL #1   Title Pt will have a decrease in low back pain to 3/10 or less at worst to promote ability to lift, sit, as well as run more comfortably.    Baseline 7/10 R low back pain at worst for the past 3 months (04/23/2022); 5/10 at worst for the past 7 days ( 06/13/2022); 3-4/10 (06/25/2022); 4-5/10 at worst for the past 7 days (08/13/2022)    Time 2    Period Weeks    Status Partially Met    Target Date 08/23/22      PT LONG TERM GOAL #2   Title Pt will have a decrease in L LE pain to 4/10 or less at worst to promote ability to lift, sit, as well as run more comfortably.    Baseline 8/10 L hamstrings/posterior thigh pain at worst for the past 3 months (04/23/2022); 2/10 at worst for the past 7 days (06/13/2022); 0/10 at most for the past 7 days (06/25/2022), (08/13/2022)    Time 8    Period Weeks    Status Achieved    Target Date 06/21/22      PT LONG TERM GOAL #3   Title Pt will improve B hip IR by at least 15 degrees to promote ability to ambulate, run, lift with less back pain.    Baseline Hip PROM -16 degrees R, 17 degrees L (04/23/2022); 4 degrees R, 15 degrees L (06/13/2022); 22 degrees R,  28 degrees L; 23  degrees R, 25 degrees L (08/13/2022)    Time 2    Period Weeks    Status Partially Met    Target Date 08/23/22      PT LONG TERM GOAL #4   Title Pt will improve his lumbar FOTO score by at least 10 points as a demonstration of improved function.    Baseline Lumbar spine FOTO: 25 (04/23/2022); 63 (06/13/2022)    Time 8    Period Weeks    Status Achieved    Target Date 06/21/22              Plan - 08/13/22 1717     Clinical Impression Statement Pt demonstrates overall improved L LE and low back pain as well as function since initial evaluation. Slight increase in worst low back pain however compared to previous measurement in which pt not performing his HEP for the past week might play a factor. Pt also reports increased low back pain when sitting for 30 minutes as well as when wrapping the pallets at work (pt has to go low towards the ground to wrap plastic around the pallet). 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 No back pain reported after session. Pt continues to have no L LE pain based  on subjective reports. Pt will benefit from continued skilled physical therapy services to decrease pain, improve strength and function.    Personal Factors and Comorbidities Time since onset of injury/illness/exacerbation;Profession    Examination-Activity Limitations Bend;Lift    Stability/Clinical Decision Making Stable/Uncomplicated    Clinical Decision Making Low    Rehab Potential Fair    PT Frequency 2x / week    PT Duration 2 weeks    PT Treatment/Interventions Therapeutic exercise;Therapeutic activities;Neuromuscular re-education;Patient/family education;Manual techniques;Dry needling;Spinal Manipulations;Joint Manipulations;Electrical Stimulation;Iontophoresis 4mg /ml Dexamethasone;Traction    PT Next Visit Plan  posture, hip IR ROM, trunk and hip strengthening, manual techniques, modalities PRN    PT Home Exercise Plan Medbridge Access Code: [email or web address]    Consulted and Agree with Plan of Care Patient                       Joneen Boers PT, DPT  08/13/2022, 6:27 PM

## 2022-08-15 ENCOUNTER — Ambulatory Visit: Payer: BLUE CROSS/BLUE SHIELD

## 2022-08-15 DIAGNOSIS — M5459 Other low back pain: Secondary | ICD-10-CM | POA: Diagnosis not present

## 2022-08-15 DIAGNOSIS — M5417 Radiculopathy, lumbosacral region: Secondary | ICD-10-CM

## 2022-08-15 DIAGNOSIS — M5432 Sciatica, left side: Secondary | ICD-10-CM

## 2022-08-15 NOTE — Therapy (Signed)
OUTPATIENT PHYSICAL THERAPY TREATMENT NOTE    Patient Name: Curtis Meyer MRN: [id number] DOB:2001-10-17, 21 y.o., male Today's Date: 08/15/2022  PCP: Venita Lick, NP  REFERRING PROVIDER: Venita Lick, NP   PT End of Session - 08/15/22 1723     Visit Number 15    Number of Visits 29    Date for PT Re-Evaluation 08/23/22    Authorization Type BCBS Other 30 VL    Authorization Time Period 04/23/22-06/21/22    Progress Note Due on Visit 10    PT Start Time 7782    PT Stop Time 4235    PT Time Calculation (min) 23 min    Activity Tolerance Patient tolerated treatment well;No increased pain    Behavior During Therapy WFL for tasks assessed/performed                         Past Medical History:  Diagnosis Date   Asthma    Eczema    Sleep-disordered breathing    Tonsillar and adenoid hypertrophy    SNORING   Past Surgical History:  Procedure Laterality Date   TONSILLECTOMY AND ADENOIDECTOMY N/A 11/16/2015   Procedure: TONSILLECTOMY AND ADENOIDECTOMY;  Surgeon: Carloyn Manner, MD;  Location: [place];  Service: ENT;  Laterality: N/A;  ADENOIDS CAUTERIZED NO TISSUE SENT   Patient Active Problem List   Diagnosis Date Noted   Chronic low back pain 04/11/2022   Left hamstring injury 04/11/2022   Pain of left lower extremity 04/11/2022   Acute left-sided low back pain with left-sided sciatica 03/01/2022   Mild intermittent asthma without complication 36/14/4315   Intrinsic atopic dermatitis 06/29/2016    REFERRING DIAG: M54.50,G89.29 (ICD-10-CM) - Chronic low back pain, unspecified back pain laterality, unspecified whether sciatica present  M79.605 (ICD-10-CM) - Pain of left lower extremity  THERAPY DIAG:  Other low back pain  Sciatica, left side  Radiculopathy, lumbosacral region  PERTINENT HISTORY: Low back pain, L LE pain. Pain began about 1.5 years ago. Pt was working at [employer]. Lifted a mattress and felt pain in low back. Pain  has improved since onset. Now his hamstrings hurt after playing soccer or running. Pain used to go down his leg (sciatic distribution), currently goes down behind his thigh not past the knee. L hamstrings pain begain about 2 months after back injury, unknown mechanism of onset. No L hamstring swelling or back. No loss of bowel or bladder control. No LE paresthesia. Pt was at work this past Fri at [employer], liftted crate up, bent down and R low back started bothering him like 1.5 years ago but not as bad. No R LE paresthesia.  PRECAUTIONS: No known precautions  SUBJECTIVE: Back is doing good, no pain currently. Did some pallets, for the most part the back was pretty good. Sitting for 30 minutes has been pretty good for the past couple of days.    PAIN:  Are you having pain?  0/10 low back pain   TODAY'S TREATMENT:  No latex allergies         Therapeutic exercise   R side planks 10 seconds x 4  Standing L lateral shift correction at wall 5x10 seconds for 2 sets   Quadruped self posterior hip capsule stretch   R 30 seconds x 4  L 30 seconds x 4   Standing gastroc then soleus stretch   R 30 seconds x 4 each   L 30 seconds x 4 each  Side  stepping while is squat position 32 ft to the R and 32 ft to the L, cues for straight back.   Forward cowboy walk while in squat position, emphasis on straight back 32 ft. X 2          Improved exercise technique, movement at target joints, use of target muscles after min to mod verbal, visual, tactile cues.        Response to treatment Pt tolerated session well without aggravation of symptoms.      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 Pt tolerated session well without aggravation of symptoms. Pt will benefit from continued skilled  physical therapy services to decrease pain, improve strength and function.       PATIENT EDUCATION: Education details: ther-ex Person educated: Patient Education method: Explanation, Demonstration, Corporate treasurer cues, and Verbal cues Education comprehension: verbalized understanding and returned demonstration   HOME EXERCISE PROGRAM: Medbridge Access Code: [email or web address] URL: [email or web address] Date: 04/23/2022 Prepared by: Joneen Boers   Exercises - Lateral Shift Correction at [place]  - 1 x daily - 7 x weekly - 3 sets - 10 reps - 5 seconds hold  - Supine Piriformis Stretch  - 1 x daily - 7 x weekly - 3 sets - 10 reps - 1 minute hold - Supine Bridge  - 1 x daily - 7 x weekly - 3 sets - 10 reps - 5 seconds hold - Static Prone on Elbows  - 3 x daily - 7 x weekly - 1 sets - 2 reps - 2 minutes hold - Prone Hip Extension with Bent Knee  - 1 x daily - 7 x weekly - 3 sets - 5 reps - 5 seconds hold  Sitting with lumbar towel roll x 2 min  - Soleus Stretch on Wall  - 3 x daily - 7 x weekly - 1 sets - 3 reps - 30 seconds hold - Gastroc Stretch on Wall  - 3 x daily - 7 x weekly - 1 sets - 3 reps - 30 seconds  hold  Quadruped self posterior hip capsule stretch   R 30 seconds x 3  L 30 seconds x 3     PT Short Term Goals - 06/13/22 1727       PT SHORT TERM GOAL #1   Title Pt will be independent with his initial HEP to decrease pain, improve strength, function, and ability to perform work related tasks as well as run more comfortably.    Baseline Pt has started his initial HEP (04/23/2022); Pt doing his HEP, no questions (06/13/2022)    Time 3    Period Weeks    Status Achieved    Target Date 05/17/22              PT Long Term Goals - 08/13/22 1720       PT LONG TERM GOAL #1   Title Pt will have a decrease in low back pain to 3/10 or less at worst to promote ability to lift, sit, as well as run more comfortably.    Baseline 7/10 R low back pain at worst for the past 3  months (04/23/2022); 5/10 at worst for the past 7 days ( 06/13/2022); 3-4/10 (06/25/2022); 4-5/10 at worst for the past 7 days (08/13/2022)    Time 2    Period  Weeks    Status Partially Met    Target Date 08/23/22      PT LONG TERM GOAL #2   Title Pt will have a decrease in L LE pain to 4/10 or less at worst to promote ability to lift, sit, as well as run more comfortably.    Baseline 8/10 L hamstrings/posterior thigh pain at worst for the past 3 months (04/23/2022); 2/10 at worst for the past 7 days (06/13/2022); 0/10 at most for the past 7 days (06/25/2022), (08/13/2022)    Time 8    Period Weeks    Status Achieved    Target Date 06/21/22      PT LONG TERM GOAL #3   Title Pt will improve B hip IR by at least 15 degrees to promote ability to ambulate, run, lift with less back pain.    Baseline Hip PROM -16 degrees R, 17 degrees L (04/23/2022); 4 degrees R, 15 degrees L (06/13/2022); 22 degrees R,  28 degrees L; 23 degrees R, 25 degrees L (08/13/2022)    Time 2    Period Weeks    Status Partially Met    Target Date 08/23/22      PT LONG TERM GOAL #4   Title Pt will improve his lumbar FOTO score by at least 10 points as a demonstration of improved function.    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 Pt tolerated session well without aggravation of symptoms. Pt will benefit from continued skilled physical therapy services to decrease pain, improve strength and function.    Personal Factors and Comorbidities Time since onset of injury/illness/exacerbation;Profession    Examination-Activity Limitations Bend;Lift     Stability/Clinical Decision Making Stable/Uncomplicated    Clinical Decision Making Low    Rehab Potential Fair    PT Frequency 2x / week    PT Duration 2 weeks    PT Treatment/Interventions Therapeutic exercise;Therapeutic activities;Neuromuscular re-education;Patient/family education;Manual techniques;Dry needling;Spinal Manipulations;Joint Manipulations;Electrical Stimulation;Iontophoresis 20m/ml Dexamethasone;Traction    PT Next Visit Plan posture, hip IR ROM, trunk and hip strengthening, manual techniques, modalities PRN    PT Home Exercise Plan Medbridge Access Code: [email or web address]    Consulted and Agree with Plan of Care Patient                        MJoneen BoersPT, DPT  08/15/2022, 5:50 PM

## 2022-08-16 DIAGNOSIS — J301 Allergic rhinitis due to pollen: Secondary | ICD-10-CM | POA: Diagnosis not present

## 2022-08-20 ENCOUNTER — Ambulatory Visit: Payer: BLUE CROSS/BLUE SHIELD

## 2022-08-20 DIAGNOSIS — M5417 Radiculopathy, lumbosacral region: Secondary | ICD-10-CM

## 2022-08-20 DIAGNOSIS — M5459 Other low back pain: Secondary | ICD-10-CM | POA: Diagnosis not present

## 2022-08-20 DIAGNOSIS — M5432 Sciatica, left side: Secondary | ICD-10-CM

## 2022-08-20 NOTE — Therapy (Signed)
OUTPATIENT PHYSICAL THERAPY TREATMENT NOTE    Patient Name: Curtis Meyer MRN: [id number] DOB:2001/09/04, 21 y.o., male Today's Date: 08/20/2022  PCP: Venita Lick, NP  REFERRING PROVIDER: Venita Lick, NP   PT End of Session - 08/20/22 1728     Visit Number 16    Number of Visits 29    Date for PT Re-Evaluation 08/23/22    Authorization Type BCBS Other 30 VL    Progress Note Due on Visit 10    PT Start Time 1728   pt arrived late   PT Stop Time 1754    PT Time Calculation (min) 26 min    Activity Tolerance Patient tolerated treatment well;No increased pain    Behavior During Therapy WFL for tasks assessed/performed                          Past Medical History:  Diagnosis Date   Asthma    Eczema    Sleep-disordered breathing    Tonsillar and adenoid hypertrophy    SNORING   Past Surgical History:  Procedure Laterality Date   TONSILLECTOMY AND ADENOIDECTOMY N/A 11/16/2015   Procedure: TONSILLECTOMY AND ADENOIDECTOMY;  Surgeon: Carloyn Manner, MD;  Location: [place];  Service: ENT;  Laterality: N/A;  ADENOIDS CAUTERIZED NO TISSUE SENT   Patient Active Problem List   Diagnosis Date Noted   Chronic low back pain 04/11/2022   Left hamstring injury 04/11/2022   Pain of left lower extremity 04/11/2022   Acute left-sided low back pain with left-sided sciatica 03/01/2022   Mild intermittent asthma without complication 82/95/6213   Intrinsic atopic dermatitis 06/29/2016    REFERRING DIAG: M54.50,G89.29 (ICD-10-CM) - Chronic low back pain, unspecified back pain laterality, unspecified whether sciatica present  M79.605 (ICD-10-CM) - Pain of left lower extremity  THERAPY DIAG:  Other low back pain  Sciatica, left side  Radiculopathy, lumbosacral region  PERTINENT HISTORY: Low back pain, L LE pain. Pain began about 1.5 years ago. Pt was working at [employer]. Lifted a mattress and felt pain in low back. Pain has improved since onset.  Now his hamstrings hurt after playing soccer or running. Pain used to go down his leg (sciatic distribution), currently goes down behind his thigh not past the knee. L hamstrings pain begain about 2 months after back injury, unknown mechanism of onset. No L hamstring swelling or back. No loss of bowel or bladder control. No LE paresthesia. Pt was at work this past Fri at [employer], liftted crate up, bent down and R low back started bothering him like 1.5 years ago but not as bad. No R LE paresthesia.  PRECAUTIONS: No known precautions  SUBJECTIVE: Back is pretty good, no pain currently. Did some pallets, and back was good. Sitting for 30 minutes had a small discomfort. Usually feels better when he stands up and leans back.  Hamstrings still doing good.    PAIN:  Are you having pain?  0/10 low back pain   TODAY'S TREATMENT:  No latex allergies         Therapeutic exercise   Standing L lateral shift correction at wall 5x10 seconds for 2 sets  R side planks 15 seconds x 4   Quadruped self posterior hip capsule stretch   R 30 seconds x 4  L 30 seconds x 4   Standing gastroc then soleus stretch at first stair step  R 30 seconds x 4 each   L 30 seconds  x 4 each   Side stepping while is squat position 32 ft to the R and 32 ft to the L, cues for straight back.    Forward cowboy walk while in squat position, emphasis on straight back 32 ft. X 2          Improved exercise technique, movement at target joints, use of target muscles after min to mod verbal, visual, tactile cues.        Response to treatment Pt tolerated session well without aggravation of symptoms.      Clinical impression  Pt arrived late so session was adjusted accordingly. Improving ability to work on pallets with less back pain based on subjective reports. 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 Pt tolerated session well without aggravation of symptoms. Pt will benefit from continued skilled physical therapy services to decrease pain, improve strength and function.       PATIENT EDUCATION: Education details: ther-ex Person educated: Patient Education method: Explanation, Demonstration, Corporate treasurer cues, and Verbal cues Education comprehension: verbalized understanding and returned demonstration   HOME EXERCISE PROGRAM: Medbridge Access Code: [email or web address] URL: [email or web address] Date: 04/23/2022 Prepared by: Joneen Boers   Exercises - Lateral Shift Correction at [place]  - 1 x daily - 7 x weekly - 3 sets - 10 reps - 5 seconds hold  - Supine Piriformis Stretch  - 1 x daily - 7 x weekly - 3 sets - 10 reps - 1 minute hold - Supine Bridge  - 1 x daily - 7 x weekly - 3 sets - 10 reps - 5 seconds hold - Static Prone on Elbows  - 3 x daily - 7 x weekly - 1 sets - 2 reps - 2 minutes hold - Prone Hip Extension with Bent Knee  - 1 x daily - 7 x weekly - 3 sets - 5 reps - 5 seconds hold  Sitting with lumbar towel roll x 2 min  - Soleus Stretch on Wall  - 3 x daily - 7 x weekly - 1 sets - 3 reps - 30 seconds hold - Gastroc Stretch on Wall  - 3 x daily - 7 x weekly - 1 sets - 3 reps - 30 seconds  hold  Quadruped self posterior hip capsule stretch   R 30 seconds x 3  L 30 seconds x 3     PT Short Term Goals - 06/13/22 1727       PT SHORT TERM GOAL #1   Title Pt will be independent with his initial HEP to decrease pain, improve strength, function, and ability to perform work related tasks as well as run more comfortably.    Baseline Pt has started his initial HEP (04/23/2022); Pt doing his HEP, no questions (06/13/2022)    Time 3    Period Weeks    Status Achieved    Target Date 05/17/22              PT Long Term Goals - 08/13/22 1720       PT LONG TERM GOAL #1   Title Pt will have  a decrease in low back pain to 3/10 or less at worst to promote ability to lift, sit, as well as run more comfortably.    Baseline 7/10 R low back pain at worst for the past 3 months (04/23/2022); 5/10  at worst for the past 7 days ( 06/13/2022); 3-4/10 (06/25/2022); 4-5/10 at worst for the past 7 days (08/13/2022)    Time 2    Period Weeks    Status Partially Met    Target Date 08/23/22      PT LONG TERM GOAL #2   Title Pt will have a decrease in L LE pain to 4/10 or less at worst to promote ability to lift, sit, as well as run more comfortably.    Baseline 8/10 L hamstrings/posterior thigh pain at worst for the past 3 months (04/23/2022); 2/10 at worst for the past 7 days (06/13/2022); 0/10 at most for the past 7 days (06/25/2022), (08/13/2022)    Time 8    Period Weeks    Status Achieved    Target Date 06/21/22      PT LONG TERM GOAL #3   Title Pt will improve B hip IR by at least 15 degrees to promote ability to ambulate, run, lift with less back pain.    Baseline Hip PROM -16 degrees R, 17 degrees L (04/23/2022); 4 degrees R, 15 degrees L (06/13/2022); 22 degrees R,  28 degrees L; 23 degrees R, 25 degrees L (08/13/2022)    Time 2    Period Weeks    Status Partially Met    Target Date 08/23/22      PT LONG TERM GOAL #4   Title Pt will improve his lumbar FOTO score by at least 10 points as a demonstration of improved function.    Baseline Lumbar spine FOTO: 25 (04/23/2022); 63 (06/13/2022)    Time 8    Period Weeks    Status Achieved    Target Date 06/21/22              Plan - 08/20/22 1731     Clinical Impression Statement Pt arrived late so session was adjusted accordingly. Improving ability to work on pallets with less back pain based on subjective reports. 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 Pt tolerated  session well without aggravation of symptoms. Pt will benefit from continued skilled physical therapy services to decrease pain, improve strength and function.    Personal Factors and Comorbidities Time since onset of injury/illness/exacerbation;Profession    Examination-Activity Limitations Bend;Lift    Stability/Clinical Decision Making Stable/Uncomplicated    Rehab Potential Fair    PT Frequency 2x / week    PT Duration 2 weeks    PT Treatment/Interventions Therapeutic exercise;Therapeutic activities;Neuromuscular re-education;Patient/family education;Manual techniques;Dry needling;Spinal Manipulations;Joint Manipulations;Electrical Stimulation;Iontophoresis 65m/ml Dexamethasone;Traction    PT Next Visit Plan posture, hip IR ROM, trunk and hip strengthening, manual techniques, modalities PRN    PT Home Exercise Plan Medbridge Access Code: [email or web address]    Consulted and Agree with Plan of Care Patient                         MJoneen BoersPT, DPT  08/20/2022, 5:55 PM

## 2022-08-22 ENCOUNTER — Ambulatory Visit: Payer: BLUE CROSS/BLUE SHIELD

## 2022-08-22 DIAGNOSIS — M5459 Other low back pain: Secondary | ICD-10-CM

## 2022-08-22 DIAGNOSIS — M5417 Radiculopathy, lumbosacral region: Secondary | ICD-10-CM

## 2022-08-22 DIAGNOSIS — M5432 Sciatica, left side: Secondary | ICD-10-CM

## 2022-08-22 NOTE — Therapy (Signed)
OUTPATIENT PHYSICAL THERAPY TREATMENT NOTE And Discharge Summary    Patient Name: Curtis Meyer MRN: [id number] DOB:February 24, 2001, 21 y.o., male Today's Date: 08/22/2022  PCP: Venita Lick, NP  REFERRING PROVIDER: Venita Lick, NP   PT End of Session - 08/22/22 1717     Visit Number 17    Number of Visits 29    Date for PT Re-Evaluation 08/23/22    Authorization Type BC[employer] Other 30 VL    Progress Note Due on Visit 10    PT Start Time 0174    PT Stop Time 9449    PT Time Calculation (min) 30 min    Activity Tolerance Patient tolerated treatment well;No increased pain    Behavior During Therapy WFL for tasks assessed/performed                           Past Medical History:  Diagnosis Date   Asthma    Eczema    Sleep-disordered breathing    Tonsillar and adenoid hypertrophy    SNORING   Past Surgical History:  Procedure Laterality Date   TONSILLECTOMY AND ADENOIDECTOMY N/A 11/16/2015   Procedure: TONSILLECTOMY AND ADENOIDECTOMY;  Surgeon: Carloyn Manner, MD;  Location: [facility];  Service: ENT;  Laterality: N/A;  ADENOIDS CAUTERIZED NO TISSUE SENT   Patient Active Problem List   Diagnosis Date Noted   Chronic low back pain 04/11/2022   Left hamstring injury 04/11/2022   Pain of left lower extremity 04/11/2022   Acute left-sided low back pain with left-sided sciatica 03/01/2022   Mild intermittent asthma without complication 67/59/1638   Intrinsic atopic dermatitis 06/29/2016    REFERRING DIAG: M54.50,G89.29 (ICD-10-CM) - Chronic low back pain, unspecified back pain laterality, unspecified whether sciatica present  M79.605 (ICD-10-CM) - Pain of left lower extremity  THERAPY DIAG:  Other low back pain  Sciatica, left side  Radiculopathy, lumbosacral region  PERTINENT HISTORY: Low back pain, L LE pain. Pain began about 1.5 years ago. Pt was working at [employer]. Lifted a mattress and felt pain in low back. Pain has improved since  onset. Now his hamstrings hurt after playing soccer or running. Pain used to go down his leg (sciatic distribution), currently goes down behind his thigh not past the knee. L hamstrings pain begain about 2 months after back injury, unknown mechanism of onset. No L hamstring swelling or back. No loss of bowel or bladder control. No LE paresthesia. Pt was at work this past Fri at [employer], liftted crate up, bent down and R low back started bothering him like 1.5 years ago but not as bad. No R LE paresthesia.  PRECAUTIONS: No known precautions  SUBJECTIVE: Back is good. No pain currently. The pallets are better. Sitting for 30 minutes have been pretty good. Has not felt much pain. Feels like he can graduate PT today.    PAIN:  Are you having pain?  0/10 low back pain   TODAY'S TREATMENT:  No latex allergies         Therapeutic exercise   Quadruped self posterior hip capsule stretch   R 30 seconds x 4  L 30 seconds x 4  R side planks 30 seconds x 2  Standing gastroc then soleus stretch at first stair step  R 30 seconds x 4 each   L 30 seconds x 4 each  Side stepping while is squat position 32 ft to the R and 32 ft to the L, cues  for straight back.    Forward cowboy walk while in squat position, emphasis on straight back 32 ft. X 2     OMEGA machine                 Rows plate 45 for 98M2 seconds for 2 sets         Improved exercise technique, movement at target joints, use of target muscles after min to mod verbal, visual, tactile cues.        Response to treatment Pt tolerated session well without aggravation of symptoms.      Clinical impression  Pt demonstrates significant decrease in low back and L LE pain, as well as well as improved function since initial evaluation. Pt has made very good progress with PT towards goals and demonstrates independence and consistency with his HEP. Skilled physical therapy services discharged with pt continuing progress with his  exercises at home.          PATIENT EDUCATION: Education details: ther-ex Person educated: Patient Education method: Explanation, Demonstration, Corporate treasurer cues, and Verbal cues Education comprehension: verbalized understanding and returned demonstration   HOME EXERCISE PROGRAM: Medbridge Access Code: [email or web address] URL: [email or web address] Date: 04/23/2022 Prepared by: Joneen Boers   Exercises - Lateral Shift Correction at [place]  - 1 x daily - 7 x weekly - 3 sets - 10 reps - 5 seconds hold  - Supine Piriformis Stretch  - 1 x daily - 7 x weekly - 3 sets - 10 reps - 1 minute hold - Supine Bridge  - 1 x daily - 7 x weekly - 3 sets - 10 reps - 5 seconds hold - Static Prone on Elbows  - 3 x daily - 7 x weekly - 1 sets - 2 reps - 2 minutes hold - Prone Hip Extension with Bent Knee  - 1 x daily - 7 x weekly - 3 sets - 5 reps - 5 seconds hold  Sitting with lumbar towel roll x 2 min  - Soleus Stretch on Wall  - 3 x daily - 7 x weekly - 1 sets - 3 reps - 30 seconds hold - Gastroc Stretch on Wall  - 3 x daily - 7 x weekly - 1 sets - 3 reps - 30 seconds  hold  Quadruped self posterior hip capsule stretch   R 30 seconds x 3  L 30 seconds x 3     PT Short Term Goals - 06/13/22 1727       PT SHORT TERM GOAL #1   Title Pt will be independent with his initial HEP to decrease pain, improve strength, function, and ability to perform work related tasks as well as run more comfortably.    Baseline Pt has started his initial HEP (04/23/2022); Pt doing his HEP, no questions (06/13/2022)    Time 3    Period Weeks    Status Achieved    Target Date 05/17/22              PT Long Term Goals - 08/22/22 1719       PT LONG TERM GOAL #1   Title Pt will have a decrease in low back pain to 3/10 or less at worst to promote ability to lift, sit, as well as run more comfortably.    Baseline 7/10 R low back pain at worst for the past 3 months (04/23/2022); 5/10 at worst for the past 7 days  ( 06/13/2022); 3-4/10 (06/25/2022); 4-5/10 at worst for the past  7 days (08/13/2022); 3/10 at worst for the past 7 days (08/22/2022)    Time 2    Period Weeks    Status Achieved    Target Date 08/23/22      PT LONG TERM GOAL #2   Title Pt will have a decrease in L LE pain to 4/10 or less at worst to promote ability to lift, sit, as well as run more comfortably.    Baseline 8/10 L hamstrings/posterior thigh pain at worst for the past 3 months (04/23/2022); 2/10 at worst for the past 7 days (06/13/2022); 0/10 at most for the past 7 days (06/25/2022), (08/13/2022)    Time 8    Period Weeks    Status Achieved    Target Date 06/21/22      PT LONG TERM GOAL #3   Title Pt will improve B hip IR by at least 15 degrees to promote ability to ambulate, run, lift with less back pain.    Baseline Hip PROM -16 degrees R, 17 degrees L (04/23/2022); 4 degrees R, 15 degrees L (06/13/2022); 22 degrees R,  28 degrees L; 23 degrees R, 25 degrees L (08/13/2022)    Time 2    Period Weeks    Status Partially Met    Target Date 08/23/22      PT LONG TERM GOAL #4   Title Pt will improve his lumbar FOTO score by at least 10 points as a demonstration of improved function.    Baseline Lumbar spine FOTO: 25 (04/23/2022); 63 (06/13/2022)    Time 8    Period Weeks    Status Achieved    Target Date 06/21/22              Plan - 08/22/22 1717     Clinical Impression Statement Pt demonstrates significant decrease in low back and L LE pain, as well as well as improved function since initial evaluation. Pt has made very good progress with PT towards goals and demonstrates independence and consistency with his HEP. Skilled physical therapy services discharged with pt continuing progress with his exercises at home.    Personal Factors and Comorbidities --    Examination-Activity Limitations --    Stability/Clinical Decision Making --    Rehab Potential --    PT Frequency --    PT Duration --    PT Treatment/Interventions  Therapeutic exercise;Therapeutic activities;Neuromuscular re-education;Patient/family education;Manual techniques    PT Next Visit Plan Continue progress with HEP    PT Home Exercise Plan Medbridge Access Code: [email or web address]    Consulted and Agree with Plan of Care Patient                       Thank you for your referral.    Joneen Boers PT, DPT  08/22/2022, 6:23 PM

## 2022-08-29 ENCOUNTER — Ambulatory Visit: Payer: BLUE CROSS/BLUE SHIELD

## 2022-08-30 DIAGNOSIS — J301 Allergic rhinitis due to pollen: Secondary | ICD-10-CM | POA: Diagnosis not present

## 2022-10-18 DIAGNOSIS — J301 Allergic rhinitis due to pollen: Secondary | ICD-10-CM | POA: Diagnosis not present

## 2022-11-09 DIAGNOSIS — J301 Allergic rhinitis due to pollen: Secondary | ICD-10-CM | POA: Diagnosis not present

## 2023-06-03 ENCOUNTER — Other Ambulatory Visit: Payer: Self-pay | Admitting: Nurse Practitioner

## 2023-06-03 NOTE — Telephone Encounter (Signed)
Medication Refill - Medication: Advair inhaler  Has the patient contacted their pharmacy? Yes.  Denied  (Agent: If no, request that the patient contact the pharmacy for the refill. If patient does not wish to contact the pharmacy document the reason why and proceed with request.) (Agent: If yes, when and what did the pharmacy advise?)  Preferred Pharmacy (with phone number or street name): [facility] [address] Has the patient been seen for an appointment in the last year OR does the patient have an upcoming appointment? Yes.    Agent: Please be advised that RX refills may take up to 3 business days. We ask that you follow-up with your pharmacy.

## 2023-06-04 NOTE — Telephone Encounter (Signed)
Requested medications are due for refill today.  yes  Requested medications are on the active medications list.  yes  Last refill. 12/26/2022  Future visit scheduled.   no  Notes to clinic.  Rx signed by Dr. Charlotta Newton. Pt recently seen at [facility] clinic. Pt last seen 1 year ago.    Requested Prescriptions  Pending Prescriptions Disp Refills   fluticasone-salmeterol (ADVAIR) 100-50 MCG/ACT AEPB 1 each 3    Sig: Inhale 1 puff into the lungs 2 (two) times daily.     Pulmonology:  Combination Products Failed - 06/03/2023 11:47 AM      Failed - Valid encounter within last 12 months    Recent Outpatient Visits           1 year ago Chronic low back pain, unspecified back pain laterality, unspecified whether sciatica present   [facility] Vigg, Avanti, MD   1 year ago Acute left-sided low back pain with left-sided sciatica    [facility] Loura Pardon, MD

## 2023-06-06 ENCOUNTER — Encounter: Payer: Self-pay | Admitting: Physician Assistant

## 2023-06-06 ENCOUNTER — Ambulatory Visit: Payer: BLUE CROSS/BLUE SHIELD | Admitting: Physician Assistant

## 2023-06-06 VITALS — BP 122/74 | HR 71 | Temp 98.3°F | Wt 257.2 lb

## 2023-06-06 DIAGNOSIS — J3089 Other allergic rhinitis: Secondary | ICD-10-CM | POA: Diagnosis not present

## 2023-06-06 DIAGNOSIS — J4520 Mild intermittent asthma, uncomplicated: Secondary | ICD-10-CM | POA: Diagnosis not present

## 2023-06-06 MED ORDER — ALBUTEROL SULFATE HFA 108 (90 BASE) MCG/ACT IN AERS
2.0000 | INHALATION_SPRAY | Freq: Four times a day (QID) | RESPIRATORY_TRACT | 2 refills | Status: DC | PRN
Start: 2023-06-06 — End: 2023-07-12

## 2023-06-06 MED ORDER — FLUTICASONE PROPIONATE 50 MCG/ACT NA SUSP
2.0000 | Freq: Every day | NASAL | 3 refills | Status: DC
Start: 2023-06-06 — End: 2023-12-13

## 2023-06-06 MED ORDER — CETIRIZINE HCL 10 MG PO TABS
10.0000 mg | ORAL_TABLET | Freq: Every day | ORAL | 1 refills | Status: AC
Start: 2023-06-06 — End: ?

## 2023-06-06 MED ORDER — FLUTICASONE-SALMETEROL 100-50 MCG/ACT IN AEPB
1.0000 | INHALATION_SPRAY | Freq: Two times a day (BID) | RESPIRATORY_TRACT | 3 refills | Status: DC
Start: 2023-06-06 — End: 2024-01-10

## 2023-06-06 NOTE — Assessment & Plan Note (Addendum)
Chronic, historic condition, currently exacerbated  Currently not managed as patient has been without medications for the past month and he is having wheezing, chest tightness and SOB PE is concerning for reduced air movement and tightness  Will send in refill of Advair and Albuterol inhalers  Reviewed ED and return precautions Recommend he return in about 4 weeks for recheck and physical

## 2023-06-06 NOTE — Progress Notes (Signed)
Acute Office Visit   Patient: Curtis Meyer   DOB: 2001/05/07   22 y.o. Male  MRN: [id number] Visit Date: 06/06/2023  Today's healthcare provider: Oswaldo Conroy Deniece Rankin, PA-C  Introduced myself to the patient as a Secondary school teacher and provided education on APPs in clinical practice.    Chief Complaint  Patient presents with   Asthma    Patient states that he has been wheezing a lot for the past 2 weeks   Subjective    HPI HPI     Asthma    Additional comments: Patient states that he has been wheezing a lot for the past 2 weeks      Last edited by Sherolyn Buba, CMA on 06/06/2023  2:02 PM.        Asthma   He reports he has been wheezing a lot at night for the past 2 weeks He denies coughing  He is out of his inhalers - he has been out of his inhalers for about a month  He has not been taking his allergy medications either    Medications: Outpatient Medications Prior to Visit  Medication Sig   diclofenac Sodium (VOLTAREN) 1 % GEL Apply 2 g topically 4 (four) times daily. (Patient not taking: Reported on 06/06/2023)   [DISCONTINUED] albuterol (PROVENTIL HFA;VENTOLIN HFA) 108 (90 BASE) MCG/ACT inhaler Inhale 2 puffs into the lungs every 6 (six) hours as needed for wheezing or shortness of breath. (Patient not taking: Reported on 06/06/2023)   [DISCONTINUED] cetirizine (ZYRTEC) 5 MG tablet Take 5 mg by mouth daily. (Patient not taking: Reported on 06/06/2023)   [DISCONTINUED] fluticasone (FLONASE) 50 MCG/ACT nasal spray Place into both nostrils daily. (Patient not taking: Reported on 06/06/2023)   [DISCONTINUED] fluticasone-salmeterol (ADVAIR) 100-50 MCG/ACT AEPB Inhale 1 puff into the lungs 2 (two) times daily. (Patient not taking: Reported on 06/06/2023)   No facility-administered medications prior to visit.    Review of Systems  Constitutional:  Negative for chills, fatigue and fever.  Eyes:  Positive for discharge and itching.  Respiratory:  Positive for chest tightness,  shortness of breath and wheezing. Negative for cough.   Cardiovascular:  Negative for chest pain and palpitations.  Neurological:  Negative for dizziness and headaches.       Objective    BP 122/74   Pulse 71   Temp 98.3 F (36.8 C) (Oral)   Wt 257 lb 3.2 oz (116.7 kg)   SpO2 99%   BMI 35.03 kg/m    Physical Exam Vitals reviewed.  Constitutional:      General: He is awake.     Appearance: Normal appearance. He is well-developed and well-groomed.  HENT:     Head: Normocephalic and atraumatic.  Eyes:     General: Gaze aligned appropriately. Allergic shiner present.     Extraocular Movements: Extraocular movements intact.     Conjunctiva/sclera: Conjunctivae normal.  Cardiovascular:     Rate and Rhythm: Normal rate and regular rhythm.     Pulses: Normal pulses.     Heart sounds: Normal heart sounds. No murmur heard.    No friction rub. No gallop.  Pulmonary:     Effort: Pulmonary effort is normal.     Breath sounds: Decreased air movement present. No wheezing, rhonchi or rales.  Musculoskeletal:     Right lower leg: No edema.     Left lower leg: No edema.  Neurological:     Mental Status: He is alert.  Psychiatric:        Behavior: Behavior is cooperative.       No results found for any visits on 06/06/23.  Assessment & Plan      Return in about 4 weeks (around 07/04/2023) for CPE, asthma recheck .       Problem List Items Addressed This Visit       Respiratory   Mild intermittent asthma without complication - Primary    Chronic, historic condition, currently exacerbated  Currently not managed as patient has been without medications for the past month and he is having wheezing, chest tightness and SOB PE is concerning for reduced air movement and tightness  Will send in refill of Advair and Albuterol inhalers  Reviewed ED and return precautions Recommend he return in about 4 weeks for recheck and physical        Relevant Medications    fluticasone-salmeterol (ADVAIR) 100-50 MCG/ACT AEPB   albuterol (VENTOLIN HFA) 108 (90 Base) MCG/ACT inhaler   cetirizine (ZYRTEC) 10 MG tablet   fluticasone (FLONASE) 50 MCG/ACT nasal spray   Other Visit Diagnoses     Environmental and seasonal allergies       Relevant Medications   cetirizine (ZYRTEC) 10 MG tablet   fluticasone (FLONASE) 50 MCG/ACT nasal spray        Return in about 4 weeks (around 07/04/2023) for CPE, asthma recheck .   I, Leelan Rajewski E Corky Blumstein, PA-C, have reviewed all documentation for this visit. The documentation on 06/06/23 for the exam, diagnosis, procedures, and orders are all accurate and complete.   Jacquelin Hawking, MHS, PA-C [facility]

## 2023-07-12 ENCOUNTER — Ambulatory Visit (INDEPENDENT_AMBULATORY_CARE_PROVIDER_SITE_OTHER): Payer: BLUE CROSS/BLUE SHIELD | Admitting: Physician Assistant

## 2023-07-12 ENCOUNTER — Encounter: Payer: Self-pay | Admitting: Physician Assistant

## 2023-07-12 VITALS — BP 105/65 | HR 68 | Temp 97.9°F | Ht 72.0 in | Wt 261.0 lb

## 2023-07-12 DIAGNOSIS — Z23 Encounter for immunization: Secondary | ICD-10-CM | POA: Diagnosis not present

## 2023-07-12 DIAGNOSIS — Z0000 Encounter for general adult medical examination without abnormal findings: Secondary | ICD-10-CM

## 2023-07-12 DIAGNOSIS — J4520 Mild intermittent asthma, uncomplicated: Secondary | ICD-10-CM

## 2023-07-12 DIAGNOSIS — Z7189 Other specified counseling: Secondary | ICD-10-CM

## 2023-07-12 LAB — URINALYSIS, ROUTINE W REFLEX MICROSCOPIC
Bilirubin, UA: NEGATIVE
Glucose, UA: NEGATIVE
Ketones, UA: NEGATIVE
Leukocytes,UA: NEGATIVE
Nitrite, UA: NEGATIVE
Protein,UA: NEGATIVE
RBC, UA: NEGATIVE
Specific Gravity, UA: 1.02 (ref 1.005–1.030)
Urobilinogen, Ur: 0.2 mg/dL (ref 0.2–1.0)
pH, UA: 6.5 (ref 5.0–7.5)

## 2023-07-12 MED ORDER — ALBUTEROL SULFATE HFA 108 (90 BASE) MCG/ACT IN AERS
2.0000 | INHALATION_SPRAY | Freq: Four times a day (QID) | RESPIRATORY_TRACT | 2 refills | Status: DC | PRN
Start: 2023-07-12 — End: 2023-07-18

## 2023-07-12 NOTE — Progress Notes (Unsigned)
Annual Physical Exam   Name: Curtis Meyer   MRN: [id number]    DOB: 04/03/2001   Date:07/15/2023  Today's Provider: Jacquelin Hawking, MHS, PA-C Introduced myself to the patient as a PA-C and provided education on APPs in clinical practice.         Subjective  Chief Complaint  Chief Complaint  Patient presents with   Annual Exam   Asthma    HPI  Patient presents for annual CPE .  Diet: He is trying to increase his fruits and vegetables and reduce eating out but has had some difficulty with this  Exercise: He is goes to the gym and uses treadmill at least once per week- usually for at least 2 miles  Sleep: "it's good" getting about 7-8 hours per night, feels well rested in the AM. He has been told he snores at night  Mood: "it's been good"      Depression: phq 9 is negative    06/06/2023    2:07 PM 04/11/2022    2:10 PM  Depression screen PHQ 2/9  Decreased Interest 0 0  Down, Depressed, Hopeless 0 0  PHQ - 2 Score 0 0  Altered sleeping 3 0  Tired, decreased energy 2 0  Change in appetite 1 0  Feeling bad or failure about yourself  1 0  Trouble concentrating 0 0  Moving slowly or fidgety/restless 0 0  Suicidal thoughts 0 0  PHQ-9 Score 7 0  Difficult doing work/chores Somewhat difficult Not difficult at all      06/06/2023    2:07 PM 04/11/2022    2:10 PM  GAD 7 : Generalized Anxiety Score  Nervous, Anxious, on Edge 0 1  Control/stop worrying 0 0  Worry too much - different things 0 0  Trouble relaxing 0 0  Restless 0 0  Easily annoyed or irritable 0 0  Afraid - awful might happen 0 0  Total GAD 7 Score 0 1  Anxiety Difficulty Not difficult at all Not difficult at all     Hypertension:  BP Readings from Last 3 Encounters:  07/12/23 105/65  06/06/23 122/74  04/11/22 124/76    Obesity: Wt Readings from Last 3 Encounters:  07/12/23 261 lb (118.4 kg)  06/06/23 257 lb 3.2 oz (116.7 kg)  04/11/22 245 lb (111.1 kg)   BMI Readings from Last 3  Encounters:  07/12/23 35.40 kg/m  06/06/23 35.03 kg/m  04/11/22 33.37 kg/m     Lipids:  Lab Results  Component Value Date   CHOL 211 (H) 07/12/2023   Lab Results  Component Value Date   HDL 54 07/12/2023   Lab Results  Component Value Date   LDLCALC 101 (H) 07/12/2023   Lab Results  Component Value Date   TRIG 334 (H) 07/12/2023   Lab Results  Component Value Date   CHOLHDL 3.9 07/12/2023   No results found for: "LDLDIRECT" Glucose:  Glucose  Date Value Ref Range Status  07/12/2023 59 (L) 70 - 99 mg/dL Final  56/43/3295 86 70 - 99 mg/dL Final      Single STD testing and prevention (HIV/chl/gon/syphilis):  no- declined today  HIV screening: ordered today  Hep C Screening: ordered today  Skin cancer: Discussed monitoring for atypical lesions Colorectal cancer screening: denies family hx - recommend routine screening at age 22  Prostate cancer screening:  not applicable No results found for: "PSA"   Lung cancer:  Low Dose CT Chest recommended  if Age 40-80 years, 30 pack-year currently smoking OR have quit w/in 15years. Patient  not applicable AAA: The USPSTF recommends one-time screening with ultrasonography in men ages 71 to 75 years who have ever smoked. Patient:  not applicable ECG:  NA  Vaccines:   HPV: declines today- education provided  Tdap: ordered and received today  Shingrix: NA Pneumonia: NA Flu: postponed until fall  COVID-19: Discussed vaccine and booster recommendations per available CDC guidelines    Advanced Care Planning: A voluntary discussion about advance care planning including the explanation and discussion of advance directives.  Discussed health care proxy and Living will, and the patient was able to identify a health care proxy as no one .  Patient does not have a living will in effect at this time.  Patient Active Problem List   Diagnosis Date Noted   Advanced care planning/counseling discussion 07/15/2023   Chronic low back  pain 04/11/2022   Left hamstring injury 04/11/2022   Pain of left lower extremity 04/11/2022   Acute left-sided low back pain with left-sided sciatica 03/01/2022   Mild intermittent asthma without complication 03/01/2022   Intrinsic atopic dermatitis 06/29/2016    Past Surgical History:  Procedure Laterality Date   TONSILLECTOMY AND ADENOIDECTOMY N/A 11/16/2015   Procedure: TONSILLECTOMY AND ADENOIDECTOMY;  Surgeon: Bud Face, MD;  Location: [facility];  Service: ENT;  Laterality: N/A;  ADENOIDS CAUTERIZED NO TISSUE SENT    History reviewed. No pertinent family history.  Social History   Socioeconomic History   Marital status: Single    Spouse name: Not on file   Number of children: Not on file   Years of education: Not on file   Highest education level: Not on file  Occupational History   Not on file  Tobacco Use   Smoking status: Never   Smokeless tobacco: Never  Vaping Use   Vaping status: Never Used  Substance and Sexual Activity   Alcohol use: Never   Drug use: Never   Sexual activity: Not Currently  Other Topics Concern   Not on file  Social History Narrative   Not on file   Social Determinants of Health   Financial Resource Strain: Not on file  Food Insecurity: Not on file  Transportation Needs: Not on file  Physical Activity: Not on file  Stress: Not on file  Social Connections: Not on file  Intimate Partner Violence: Not on file     Current Outpatient Medications:    Abrocitinib (CIBINQO PO), Take 1 tablet by mouth daily., Disp: , Rfl:    cetirizine (ZYRTEC) 10 MG tablet, Take 1 tablet (10 mg total) by mouth daily., Disp: 60 tablet, Rfl: 1   fluticasone-salmeterol (ADVAIR) 100-50 MCG/ACT AEPB, Inhale 1 puff into the lungs 2 (two) times daily., Disp: 1 each, Rfl: 3   albuterol (VENTOLIN HFA) 108 (90 Base) MCG/ACT inhaler, Inhale 2 puffs into the lungs every 6 (six) hours as needed for wheezing or shortness of breath., Disp: 1 each, Rfl:  2   diclofenac Sodium (VOLTAREN) 1 % GEL, Apply 2 g topically 4 (four) times daily. (Patient not taking: Reported on 06/06/2023), Disp: 2 g, Rfl: 1   fluticasone (FLONASE) 50 MCG/ACT nasal spray, Place 2 sprays into both nostrils daily. (Patient not taking: Reported on 07/12/2023), Disp: 15.8 mL, Rfl: 3  No Known Allergies   Review of Systems  Constitutional:  Negative for chills, fever and weight loss.  HENT:  Negative for hearing loss, nosebleeds and tinnitus.  Eyes:  Negative for blurred vision and double vision.  Respiratory:  Positive for shortness of breath (With activity and resolves with ALbuterol inhaler use) and wheezing.   Cardiovascular:  Negative for chest pain, palpitations and leg swelling.  Gastrointestinal:  Positive for nausea. Negative for blood in stool, constipation, diarrhea, heartburn and vomiting.  Genitourinary:  Negative for dysuria and hematuria.  Musculoskeletal:  Negative for falls, joint pain and myalgias.  Skin:  Positive for itching and rash.  Neurological:  Negative for dizziness, tingling, tremors, loss of consciousness, weakness and headaches.  Psychiatric/Behavioral:  Negative for depression, memory loss, substance abuse and suicidal ideas. The patient is nervous/anxious. The patient does not have insomnia.        Objective  Vitals:   07/12/23 1356  BP: 105/65  Pulse: 68  Temp: 97.9 F (36.6 C)  TempSrc: Oral  SpO2: 99%  Weight: 261 lb (118.4 kg)  Height: 6' (1.829 m)    Body mass index is 35.4 kg/m.  Physical Exam Vitals reviewed.  Constitutional:      General: He is awake.     Appearance: Normal appearance. He is well-developed and well-groomed.  HENT:     Head: Normocephalic and atraumatic.     Right Ear: Hearing, tympanic membrane and ear canal normal. There is impacted cerumen.     Left Ear: Hearing, tympanic membrane and ear canal normal. There is impacted cerumen.     Mouth/Throat:     Lips: Pink.     Mouth: Mucous  membranes are moist.     Pharynx: Oropharynx is clear. Uvula midline. No oropharyngeal exudate or posterior oropharyngeal erythema.  Eyes:     General: Lids are normal. Gaze aligned appropriately.     Extraocular Movements: Extraocular movements intact.     Right eye: Normal extraocular motion and no nystagmus.     Left eye: Normal extraocular motion and no nystagmus.     Conjunctiva/sclera: Conjunctivae normal.     Pupils: Pupils are equal, round, and reactive to light.  Neck:     Thyroid: No thyroid mass, thyromegaly or thyroid tenderness.  Cardiovascular:     Rate and Rhythm: Normal rate and regular rhythm.     Pulses: Normal pulses.          Radial pulses are 2+ on the right side and 2+ on the left side.     Heart sounds: Normal heart sounds. No murmur heard.    No friction rub. No gallop.  Pulmonary:     Effort: Pulmonary effort is normal.     Breath sounds: Normal breath sounds. No decreased air movement. No decreased breath sounds, wheezing, rhonchi or rales.  Abdominal:     General: Abdomen is flat. Bowel sounds are normal.     Palpations: Abdomen is soft.     Tenderness: There is no abdominal tenderness.  Genitourinary:    Comments: Patient denies concerns for genital area today. Exam deferred after shared-decision making.   Musculoskeletal:     Cervical back: Normal range of motion and neck supple. No rigidity. Normal range of motion.     Right lower leg: No edema.     Left lower leg: No edema.  Lymphadenopathy:     Head:     Right side of head: No submental, submandibular or preauricular adenopathy.     Left side of head: No submental, submandibular or preauricular adenopathy.     Cervical:     Right cervical: No superficial or posterior cervical adenopathy.  Left cervical: No superficial or posterior cervical adenopathy.     Upper Body:     Right upper body: No supraclavicular adenopathy.     Left upper body: No supraclavicular adenopathy.  Skin:    Findings:  Rash present. Rash is macular, papular and scaling.     Comments: Scaling maculopapular rash on right shoulder, behind knees   Neurological:     General: No focal deficit present.     Mental Status: He is alert and oriented to person, place, and time.     GCS: GCS eye subscore is 4. GCS verbal subscore is 5. GCS motor subscore is 6.     Cranial Nerves: No dysarthria or facial asymmetry.     Motor: No weakness, tremor or atrophy.     Gait: Gait is intact.  Psychiatric:        Attention and Perception: Attention and perception normal.        Mood and Affect: Mood and affect normal.        Speech: Speech normal.        Behavior: Behavior normal. Behavior is cooperative.      Recent Results (from the past 2160 hour(s))  Urinalysis, Routine w reflex microscopic     Status: None   Collection Time: 07/12/23  2:33 PM  Result Value Ref Range   Specific Gravity, UA 1.020 1.005 - 1.030   pH, UA 6.5 5.0 - 7.5   Color, UA Yellow Yellow   Appearance Ur Clear Clear   Leukocytes,UA Negative Negative   Protein,UA Negative Negative/Trace   Glucose, UA Negative Negative   Ketones, UA Negative Negative   RBC, UA Negative Negative   Bilirubin, UA Negative Negative   Urobilinogen, Ur 0.2 0.2 - 1.0 mg/dL   Nitrite, UA Negative Negative   Microscopic Examination Comment     Comment: Microscopic not indicated and not performed.  CBC w/Diff     Status: Abnormal   Collection Time: 07/12/23  2:41 PM  Result Value Ref Range   WBC 7.7 3.4 - 10.8 x10E3/uL   RBC 5.25 4.14 - 5.80 x10E6/uL   Hemoglobin 14.9 13.0 - 17.7 g/dL   Hematocrit 16.1 09.6 - 51.0 %   MCV 87 79 - 97 fL   MCH 28.4 26.6 - 33.0 pg   MCHC 32.6 31.5 - 35.7 g/dL   RDW 04.5 40.9 - 81.1 %   Platelets 203 150 - 450 x10E3/uL   Neutrophils 38 Not Estab. %   Lymphs 46 Not Estab. %   Monocytes 6 Not Estab. %   Eos 9 Not Estab. %   Basos 1 Not Estab. %   Neutrophils Absolute 2.9 1.4 - 7.0 x10E3/uL   Lymphocytes Absolute 3.5 (H) 0.7 - 3.1  x10E3/uL   Monocytes Absolute 0.5 0.1 - 0.9 x10E3/uL   EOS (ABSOLUTE) 0.7 (H) 0.0 - 0.4 x10E3/uL   Basophils Absolute 0.1 0.0 - 0.2 x10E3/uL   Immature Granulocytes 0 Not Estab. %   Immature Grans (Abs) 0.0 0.0 - 0.1 x10E3/uL  Comp Met (CMET)     Status: Abnormal   Collection Time: 07/12/23  2:41 PM  Result Value Ref Range   Glucose 59 (L) 70 - 99 mg/dL   BUN 8 6 - 20 mg/dL   Creatinine, Ser 9.14 0.76 - 1.27 mg/dL   eGFR 96 >78 GN/FAO/1.30   BUN/Creatinine Ratio 7 (L) 9 - 20   Sodium 140 134 - 144 mmol/L   Potassium 4.3 3.5 - 5.2 mmol/L  Chloride 101 96 - 106 mmol/L   CO2 25 20 - 29 mmol/L   Calcium 10.2 8.7 - 10.2 mg/dL   Total Protein 7.9 6.0 - 8.5 g/dL   Albumin 5.0 4.3 - 5.2 g/dL   Globulin, Total 2.9 1.5 - 4.5 g/dL   Bilirubin Total 0.6 0.0 - 1.2 mg/dL   Alkaline Phosphatase 120 44 - 121 IU/L   AST 46 (H) 0 - 40 IU/L   ALT 67 (H) 0 - 44 IU/L  Lipid Profile     Status: Abnormal   Collection Time: 07/12/23  2:41 PM  Result Value Ref Range   Cholesterol, Total 211 (H) 100 - 199 mg/dL   Triglycerides 161 (H) 0 - 149 mg/dL   HDL 54 >09 mg/dL   VLDL Cholesterol Cal 56 (H) 5 - 40 mg/dL   LDL Chol Calc (NIH) 604 (H) 0 - 99 mg/dL   Chol/HDL Ratio 3.9 0.0 - 5.0 ratio    Comment:                                   T. Chol/HDL Ratio                                             Men  Women                               1/2 Avg.Risk  3.4    3.3                                   Avg.Risk  5.0    4.4                                2X Avg.Risk  9.6    7.1                                3X Avg.Risk 23.4   11.0   TSH     Status: None   Collection Time: 07/12/23  2:41 PM  Result Value Ref Range   TSH 1.600 0.450 - 4.500 uIU/mL  HgB A1c     Status: None   Collection Time: 07/12/23  2:41 PM  Result Value Ref Range   Hgb A1c MFr Bld 5.5 4.8 - 5.6 %    Comment:          Prediabetes: 5.7 - 6.4          Diabetes: >6.4          Glycemic control for adults with diabetes: <7.0    Est.  average glucose Bld gHb Est-mCnc 111 mg/dL  HIV antibody (with reflex)     Status: None   Collection Time: 07/12/23  2:41 PM  Result Value Ref Range   HIV Screen 4th Generation wRfx Non Reactive Non Reactive    Comment: HIV-1/HIV-2 antibodies and HIV-1 p24 antigen were NOT detected. There is no laboratory evidence of HIV infection. HIV Negative   Hepatitis C antibody     Status: None   Collection Time: 07/12/23  2:41 PM  Result Value Ref Range   Hep C Virus Ab Non Reactive Non Reactive    Comment: HCV antibody alone does not differentiate between previously resolved infection and active infection. Equivocal and Reactive HCV antibody results should be followed up with an HCV RNA test to support the diagnosis of active HCV infection.      Fall Risk:    06/06/2023    2:07 PM 04/11/2022    2:10 PM  Fall Risk   Falls in the past year? 0 0  Number falls in past yr: 0 0  Injury with Fall? 0 0  Risk for fall due to : No Fall Risks No Fall Risks  Follow up  Falls evaluation completed     Functional Status Survey:      Assessment & Plan  Problem List Items Addressed This Visit       Respiratory   Mild intermittent asthma without complication    Chronic, ongoing  Patient reports he was not able to pick up new Albuterol inhaler but has been using Advair  He reports some SOBOE but this typically improves with rescue inhaler use  Will provide refills today Discussed appropriate use, especially in anticipation of increased exertion or outdoor activity  Follow up in 6 months or sooner if concerns arise         Relevant Medications   albuterol (VENTOLIN HFA) 108 (90 Base) MCG/ACT inhaler     Other   Advanced care planning/counseling discussion    A voluntary discussion about advance care planning including the explanation and discussion of advance directives was extensively discussed  with the patient for 5 minutes with patient and myself present.  Explanation about the  health care proxy and Living will was reviewed and packet with forms with explanation of how to fill them out was given.  During this discussion, the patient was not able to identify a health care proxy and plans to fill out the paperwork required.  Patient was offered a separate Advance Care Planning visit for further assistance with forms.         Other Visit Diagnoses     Annual physical exam    -  Primary  -Prostate cancer screening and PSA options (with potential risks and benefits of testing vs not testing) were discussed along with recent recs/guidelines. -USPSTF grade A and B recommendations reviewed with patient; age-appropriate recommendations, preventive care, screening tests, etc discussed and encouraged; healthy living encouraged; see AVS for patient education given to patient -Discussed importance of 150 minutes of physical activity weekly, eat two servings of fish weekly, eat one serving of tree nuts ( cashews, pistachios, pecans, almonds.Marland Kitchen) every other day, eat 6 servings of fruit/vegetables daily and drink plenty of water and avoid sweet beverages.  -Reviewed Health Maintenance: yes    Relevant Orders   CBC w/Diff (Completed)   Comp Met (CMET) (Completed)   Lipid Profile (Completed)   TSH (Completed)   HgB A1c (Completed)   HIV antibody (with reflex) (Completed)   Hepatitis C antibody (Completed)   Urinalysis, Routine w reflex microscopic (Completed)   Need for tetanus booster       Relevant Orders   Tdap vaccine greater than or equal to 7yo IM (Completed)        Return in about 6 months (around 01/12/2024) for asthma, .   I, Jamielyn Petrucci E Yony Roulston, PA-C, have reviewed all documentation for this visit. The documentation on 07/15/23 for the exam, diagnosis, procedures, and orders are  all accurate and complete.   Jacquelin Hawking, MHS, PA-C [facility]

## 2023-07-13 LAB — CBC WITH DIFFERENTIAL/PLATELET
Basophils Absolute: 0.1 10*3/uL (ref 0.0–0.2)
Basos: 1 %
EOS (ABSOLUTE): 0.7 10*3/uL — ABNORMAL HIGH (ref 0.0–0.4)
Eos: 9 %
Hematocrit: 45.7 % (ref 37.5–51.0)
Hemoglobin: 14.9 g/dL (ref 13.0–17.7)
Immature Grans (Abs): 0 10*3/uL (ref 0.0–0.1)
Immature Granulocytes: 0 %
Lymphocytes Absolute: 3.5 10*3/uL — ABNORMAL HIGH (ref 0.7–3.1)
Lymphs: 46 %
MCH: 28.4 pg (ref 26.6–33.0)
MCHC: 32.6 g/dL (ref 31.5–35.7)
MCV: 87 fL (ref 79–97)
Monocytes Absolute: 0.5 10*3/uL (ref 0.1–0.9)
Monocytes: 6 %
Neutrophils Absolute: 2.9 10*3/uL (ref 1.4–7.0)
Neutrophils: 38 %
Platelets: 203 10*3/uL (ref 150–450)
RBC: 5.25 x10E6/uL (ref 4.14–5.80)
RDW: 14.1 % (ref 11.6–15.4)
WBC: 7.7 10*3/uL (ref 3.4–10.8)

## 2023-07-13 LAB — LIPID PANEL
Chol/HDL Ratio: 3.9 ratio (ref 0.0–5.0)
Cholesterol, Total: 211 mg/dL — ABNORMAL HIGH (ref 100–199)
HDL: 54 mg/dL (ref >39)
LDL Chol Calc (NIH): 101 mg/dL — ABNORMAL HIGH (ref 0–99)
Triglycerides: 334 mg/dL — ABNORMAL HIGH (ref 0–149)
VLDL Cholesterol Cal: 56 mg/dL — ABNORMAL HIGH (ref 5–40)

## 2023-07-13 LAB — COMPREHENSIVE METABOLIC PANEL
ALT: 67 IU/L — ABNORMAL HIGH (ref 0–44)
AST: 46 IU/L — ABNORMAL HIGH (ref 0–40)
Albumin: 5 g/dL (ref 4.3–5.2)
Alkaline Phosphatase: 120 IU/L (ref 44–121)
BUN/Creatinine Ratio: 7 — ABNORMAL LOW (ref 9–20)
BUN: 8 mg/dL (ref 6–20)
Bilirubin Total: 0.6 mg/dL (ref 0.0–1.2)
CO2: 25 mmol/L (ref 20–29)
Calcium: 10.2 mg/dL (ref 8.7–10.2)
Chloride: 101 mmol/L (ref 96–106)
Creatinine, Ser: 1.11 mg/dL (ref 0.76–1.27)
Globulin, Total: 2.9 g/dL (ref 1.5–4.5)
Glucose: 59 mg/dL — ABNORMAL LOW (ref 70–99)
Potassium: 4.3 mmol/L (ref 3.5–5.2)
Sodium: 140 mmol/L (ref 134–144)
Total Protein: 7.9 g/dL (ref 6.0–8.5)
eGFR: 96 mL/min/{1.73_m2} (ref >59)

## 2023-07-13 LAB — HEPATITIS C ANTIBODY: Hep C Virus Ab: NONREACTIVE

## 2023-07-13 LAB — TSH: TSH: 1.6 u[IU]/mL (ref 0.450–4.500)

## 2023-07-13 LAB — HEMOGLOBIN A1C
Est. average glucose Bld gHb Est-mCnc: 111 mg/dL
Hgb A1c MFr Bld: 5.5 % (ref 4.8–5.6)

## 2023-07-13 LAB — HIV ANTIBODY (ROUTINE TESTING W REFLEX): HIV Screen 4th Generation wRfx: NONREACTIVE

## 2023-07-15 DIAGNOSIS — Z7189 Other specified counseling: Secondary | ICD-10-CM | POA: Insufficient documentation

## 2023-07-15 NOTE — Assessment & Plan Note (Signed)
Chronic, ongoing  Patient reports he was not able to pick up new Albuterol inhaler but has been using Advair  He reports some SOBOE but this typically improves with rescue inhaler use  Will provide refills today Discussed appropriate use, especially in anticipation of increased exertion or outdoor activity  Follow up in 6 months or sooner if concerns arise

## 2023-07-15 NOTE — Assessment & Plan Note (Signed)

## 2023-07-18 ENCOUNTER — Other Ambulatory Visit: Payer: Self-pay | Admitting: Physician Assistant

## 2023-07-18 DIAGNOSIS — J4520 Mild intermittent asthma, uncomplicated: Secondary | ICD-10-CM

## 2023-07-18 MED ORDER — AIRSUPRA 90-80 MCG/ACT IN AERO
2.0000 | INHALATION_SPRAY | Freq: Four times a day (QID) | RESPIRATORY_TRACT | 3 refills | Status: DC | PRN
Start: 2023-07-18 — End: 2024-01-10

## 2023-07-18 NOTE — Progress Notes (Signed)
Your lab results Electrolytes, kidney function are in overall normal ranges and appears stable compared to previous results. Your blood count does not show signs of anemia Your cholesterol level are bit high.  At this time medication is not indicated but I do recommend dietary and lifestyle changes to help manage.  This would include reducing your consumption of saturated fats and exercising regularly throughout the week. Your thyroid testing was normal Your A1c was 5.5 which is normal Your HIV and hepatitis C screenings were negative Your urinalysis was normal Several of your liver enzymes appear to be very mildly elevated.  Please make sure that you are treating your consumption of saturated fats and exercising.  Please also refrain from using medications or substances that could cause excess strain on your liver such as Tylenol and alcohol.  You can recheck this in several months to make sure that it is returning to normal.  Please let us know if you have further questions or concerns. Your insurance denied coverage of the albuterol inhaler so I have sent in a different prescription for medication called air supra.  This should be covered by your insurance.  Please let us know if you have further questions or concerns

## 2023-12-12 ENCOUNTER — Ambulatory Visit: Payer: Self-pay

## 2023-12-12 NOTE — Telephone Encounter (Signed)
  Chief Complaint: "eczema" to neck, arm  Symptoms: itching, red and dry and scaly in appearance Frequency: last week Pertinent Negatives: Patient denies fever, SOB, fever, blisters Disposition: [] ED /[] Urgent Care (no appt availability in office) / [x] Appointment(In office/virtual)/ []  [place] [facility]/ [] Home Care/ [] Refused Recommended Disposition /[] [place] [facility]/ []  Follow-up with PCP Additional Notes: pt sees dermatologist but stated he did not get a return call. Usually uses triamcinolone cream. Reason for Disposition  Localized rash present > 7 days  Answer Assessment - Initial Assessment Questions 1. APPEARANCE of RASH: "Describe the rash."      Red and dry --scaly 2. LOCATION: "Where is the rash located?"      Neck, arm 4. SIZE: "How big are the spots?" (Inches, centimeters or compare to size of a coin)     Arm bigger than half  5. ONSET: "When did the rash start?"      Neck: last week  6. ITCHING: "Does the rash itch?" If Yes, ask: "How bad is the itch?"  (Scale 0-10; or none, mild, moderate, severe)     Itchy severe 7. PAIN: "Does the rash hurt?" If Yes, ask: "How bad is the pain?"  (Scale 0-10; or none, mild, moderate, severe)    - NONE (0): no pain    - MILD (1-3): doesn't interfere with normal activities     - MODERATE (4-7): interferes with normal activities or awakens from sleep     - SEVERE (8-10): excruciating pain, unable to do any normal activities     none 8. OTHER SYMPTOMS: "Do you have any other symptoms?" (e.g., fever)     no  Protocols used: Rash or Redness - Localized-A-AH

## 2023-12-13 ENCOUNTER — Encounter: Payer: Self-pay | Admitting: Nurse Practitioner

## 2023-12-13 ENCOUNTER — Ambulatory Visit: Payer: 59 | Admitting: Nurse Practitioner

## 2023-12-13 VITALS — BP 135/84 | HR 73 | Temp 97.9°F | Ht 73.0 in | Wt 254.2 lb

## 2023-12-13 DIAGNOSIS — L2084 Intrinsic (allergic) eczema: Secondary | ICD-10-CM

## 2023-12-13 MED ORDER — TRIAMCINOLONE ACETONIDE 0.1 % EX CREA: 1.0000 | TOPICAL_CREAM | Freq: Two times a day (BID) | CUTANEOUS | 0 refills | Status: DC

## 2023-12-13 NOTE — Assessment & Plan Note (Signed)
Chronic, currently unable to take medication dermatology orders due to no insurance.  Will restart in new year.  Refill on Triamcinolone sent and advised him if too costly could try OTC hydrocortisone for now until new insurance begins.

## 2023-12-13 NOTE — Patient Instructions (Signed)
Eczema Eczema refers to a group of skin conditions that cause skin to become rough and inflamed. Each type of eczema has different triggers, symptoms, and treatments. Eczema of any type is usually itchy. Symptoms range from mild to severe. Eczema is not spread from person to person (is not contagious). It can appear on different parts of the body at different times. One person's eczema may look different from another person's eczema. What are the causes? The exact cause of this condition is not known. However, exposure to certain environmental factors, irritants, and allergens can make the condition worse. What are the signs or symptoms? Symptoms of this condition depend on the type of eczema you have. The types include: Contact dermatitis. There are two kinds: Irritant contact dermatitis. This happens when something irritates the skin and causes a rash. Allergic contact dermatitis. This happens when your skin comes in contact with something you are allergic to (allergens). This can include poison ivy, chemicals, or medicines that were applied to your skin. Atopic dermatitis. This is a long-term (chronic) skin disease that keeps coming back (recurring). It is the most common type of eczema. Usual symptoms are a red rash and itchy, dry, scaly skin. It usually starts showing signs in infancy and can last through adulthood. Dyshidrotic eczema. This is a form of eczema on the hands and feet. It shows up as very itchy, fluid-filled blisters. It can affect people of any age but is more common before age 40. Hand eczema. This causes very itchy areas of skin on the palms and sides of the hands and fingers. This type of eczema is common in industrial jobs where you may be exposed to different types of irritants. Lichen simplex chronicus. This type of eczema occurs when a person constantly scratches one area of the body. Repeated scratching of the area leads to thickened skin (lichenification). This condition can  accompany other types of eczema. It is more common in adults but may also be seen in children. Nummular eczema. This is a common type of eczema that most often affects the lower legs and the backs of the hands. It typically causes an itchy, red, circular, crusty lesion (plaque). Scratching may become a habit and can cause bleeding. Nummular eczema occurs most often in middle-aged or older people. Seborrheic dermatitis. This is a common skin disease that mainly affects the scalp. It may also affect other oily areas of the body, such as the face, sides of the nose, eyebrows, ears, eyelids, and chest. It is marked by small scaling and redness of the skin (erythema). This can affect people of all ages. In infants, this condition is called cradle cap. Stasis dermatitis. This is a common skin disease that can cause itching, scaling, and hyperpigmentation, usually on the legs and feet. It occurs most often in people who have a condition that prevents blood from being pumped through the veins in the legs (chronic venous insufficiency). Stasis dermatitis is a chronic condition that needs long-term management. How is this diagnosed? This condition may be diagnosed based on: A physical exam of your skin. Your medical history. Skin patch tests. These tests involve using patches that contain possible allergens and placing them on your back. Your health care provider will check in a few days to see if an allergic reaction occurred. How is this treated? Treatment for eczema is based on the type of eczema you have. You may be given hydrocortisone steroid medicine or antihistamines. These can relieve itching quickly and help reduce inflammation.   These may be prescribed or purchased over the counter, depending on the strength that is needed. Follow these instructions at home: Take or apply over-the-counter and prescription medicines only as told by your health care provider. Use creams or ointments to moisturize your  skin. Do not use lotions. Learn what triggers or irritates your symptoms so you can avoid these things. Treat symptom flare-ups quickly. Do not scratch your skin. This can make your rash worse. Keep all follow-up visits. This is important. Where to find more information American Academy of Dermatology: aad.org National Eczema Association: nationaleczema.org The Society for Pediatric Dermatology: pedsderm.net Contact a health care provider if: You have severe itching, even with treatment. You scratch your skin regularly until it bleeds. Your rash looks different than usual. Your skin is painful, swollen, or more red than usual. You have a fever. Summary Eczema refers to a group of skin conditions that cause skin to become rough and inflamed. Each type has different triggers. Eczema of any type causes itching that may range from mild to severe. Treatment varies based on the type of eczema you have. Hydrocortisone steroid medicine or antihistamines can help with itching and inflammation. Protecting your skin is the best way to prevent eczema. Use creams or ointments to moisturize your skin. Avoid triggers and irritants. Treat flare-ups quickly. This information is not intended to replace advice given to you by your health care provider. Make sure you discuss any questions you have with your health care provider. Document Revised: 09/16/2020 Document Reviewed: 09/19/2020 Elsevier Patient Education  2024 [employer]  

## 2023-12-13 NOTE — Progress Notes (Signed)
BP 135/84 (BP Location: Left Arm, Patient Position: Sitting, Cuff Size: Large)   Pulse 73   Temp 97.9 F (36.6 C) (Oral)   Ht 6\' 1"  (1.854 m)   Wt 254 lb 3.2 oz (115.3 kg)   SpO2 97%   BMI 33.54 kg/m    Subjective:    Patient ID: Curtis Meyer, male    DOB: [birth year], 22 y.o.   MRN: [id number]  HPI: Curtis Meyer is a 22 y.o. male  Chief Complaint  Patient presents with   Eczema    Around the collar of the neck, inner arms, hands and back, has been getting worse since Monday, no prior treatment    RASH Started at beginning of week to various areas.  Has eczema and has been treated in past.  Does follow dermatology and takes medication for this, but currently no insurance until new year. Duration:  days  Location: generalized  Itching: no Burning: no Redness: yes Oozing: no Scaling: yes Blisters: no Painful: no Fevers: no Change in detergents/soaps/personal care products: no Recent illness: no Recent travel:no History of same: yes Context: worse Alleviating factors: nothing Treatments attempted: Triamcinolone Shortness of breath: no  Throat/tongue swelling: no Myalgias/arthralgias: no   Relevant past medical, surgical, family and social history reviewed and updated as indicated. Interim medical history since our last visit reviewed. Allergies and medications reviewed and updated.  Review of Systems  Constitutional:  Negative for activity change, diaphoresis, fatigue and fever.  Respiratory:  Negative for cough, chest tightness, shortness of breath and wheezing.   Cardiovascular:  Negative for chest pain, palpitations and leg swelling.  Gastrointestinal: Negative.   Skin:  Positive for rash.  Neurological: Negative.   Psychiatric/Behavioral: Negative.     Per HPI unless specifically indicated above     Objective:    BP 135/84 (BP Location: Left Arm, Patient Position: Sitting, Cuff Size: Large)   Pulse 73   Temp 97.9 F (36.6 C) (Oral)   Ht 6\' 1"   (1.854 m)   Wt 254 lb 3.2 oz (115.3 kg)   SpO2 97%   BMI 33.54 kg/m   Wt Readings from Last 3 Encounters:  12/13/23 254 lb 3.2 oz (115.3 kg)  07/12/23 261 lb (118.4 kg)  06/06/23 257 lb 3.2 oz (116.7 kg)    Physical Exam Vitals and nursing note reviewed.  Constitutional:      General: He is awake. He is not in acute distress.    Appearance: He is well-developed and well-groomed. He is obese. He is not ill-appearing or toxic-appearing.  HENT:     Head: Normocephalic.     Right Ear: Hearing and external ear normal.     Left Ear: Hearing and external ear normal.  Eyes:     General: Lids are normal.     Extraocular Movements: Extraocular movements intact.     Conjunctiva/sclera: Conjunctivae normal.  Neck:     Thyroid: No thyromegaly.     Vascular: No carotid bruit.  Cardiovascular:     Rate and Rhythm: Normal rate and regular rhythm.     Heart sounds: Normal heart sounds. No murmur heard.    No gallop.  Pulmonary:     Effort: No accessory muscle usage or respiratory distress.     Breath sounds: Normal breath sounds.  Abdominal:     General: Bowel sounds are normal. There is no distension.     Palpations: Abdomen is soft.     Tenderness: There is no abdominal tenderness.  Musculoskeletal:     Cervical back: Full passive range of motion without pain.     Right lower leg: No edema.     Left lower leg: No edema.  Lymphadenopathy:     Cervical: No cervical adenopathy.  Skin:    General: Skin is warm.     Capillary Refill: Capillary refill takes less than 2 seconds.     Findings: Rash present. Rash is scaling.     Comments: Eczema rash to various areas neck and arms.  Neurological:     Mental Status: He is alert and oriented to person, place, and time.     Deep Tendon Reflexes: Reflexes are normal and symmetric.     Reflex Scores:      Brachioradialis reflexes are 2+ on the right side and 2+ on the left side.      Patellar reflexes are 2+ on the right side and 2+ on the  left side. Psychiatric:        Attention and Perception: Attention normal.        Mood and Affect: Mood normal.        Speech: Speech normal.        Behavior: Behavior normal. Behavior is cooperative.        Thought Content: Thought content normal.    Results for orders placed or performed in visit on 07/12/23  Urinalysis, Routine w reflex microscopic   Collection Time: 07/12/23  2:33 PM  Result Value Ref Range   Specific Gravity, UA 1.020 1.005 - 1.030   pH, UA 6.5 5.0 - 7.5   Color, UA Yellow Yellow   Appearance Ur Clear Clear   Leukocytes,UA Negative Negative   Protein,UA Negative Negative/Trace   Glucose, UA Negative Negative   Ketones, UA Negative Negative   RBC, UA Negative Negative   Bilirubin, UA Negative Negative   Urobilinogen, Ur 0.2 0.2 - 1.0 mg/dL   Nitrite, UA Negative Negative   Microscopic Examination Comment   CBC w/Diff   Collection Time: 07/12/23  2:41 PM  Result Value Ref Range   WBC 7.7 3.4 - 10.8 x10E3/uL   RBC 5.25 4.14 - 5.80 x10E6/uL   Hemoglobin 14.9 13.0 - 17.7 g/dL   Hematocrit 91.4 78.2 - 51.0 %   MCV 87 79 - 97 fL   MCH 28.4 26.6 - 33.0 pg   MCHC 32.6 31.5 - 35.7 g/dL   RDW 95.6 21.3 - 08.6 %   Platelets 203 150 - 450 x10E3/uL   Neutrophils 38 Not Estab. %   Lymphs 46 Not Estab. %   Monocytes 6 Not Estab. %   Eos 9 Not Estab. %   Basos 1 Not Estab. %   Neutrophils Absolute 2.9 1.4 - 7.0 x10E3/uL   Lymphocytes Absolute 3.5 (H) 0.7 - 3.1 x10E3/uL   Monocytes Absolute 0.5 0.1 - 0.9 x10E3/uL   EOS (ABSOLUTE) 0.7 (H) 0.0 - 0.4 x10E3/uL   Basophils Absolute 0.1 0.0 - 0.2 x10E3/uL   Immature Granulocytes 0 Not Estab. %   Immature Grans (Abs) 0.0 0.0 - 0.1 x10E3/uL  Comp Met (CMET)   Collection Time: 07/12/23  2:41 PM  Result Value Ref Range   Glucose 59 (L) 70 - 99 mg/dL   BUN 8 6 - 20 mg/dL   Creatinine, Ser 5.78 0.76 - 1.27 mg/dL   eGFR 96 >46 NG/EXB/2.84   BUN/Creatinine Ratio 7 (L) 9 - 20   Sodium 140 134 - 144 mmol/L   Potassium  4.3 3.5 - 5.2  mmol/L   Chloride 101 96 - 106 mmol/L   CO2 25 20 - 29 mmol/L   Calcium 10.2 8.7 - 10.2 mg/dL   Total Protein 7.9 6.0 - 8.5 g/dL   Albumin 5.0 4.3 - 5.2 g/dL   Globulin, Total 2.9 1.5 - 4.5 g/dL   Bilirubin Total 0.6 0.0 - 1.2 mg/dL   Alkaline Phosphatase 120 44 - 121 IU/L   AST 46 (H) 0 - 40 IU/L   ALT 67 (H) 0 - 44 IU/L  Lipid Profile   Collection Time: 07/12/23  2:41 PM  Result Value Ref Range   Cholesterol, Total 211 (H) 100 - 199 mg/dL   Triglycerides 119 (H) 0 - 149 mg/dL   HDL 54 >14 mg/dL   VLDL Cholesterol Cal 56 (H) 5 - 40 mg/dL   LDL Chol Calc (NIH) 782 (H) 0 - 99 mg/dL   Chol/HDL Ratio 3.9 0.0 - 5.0 ratio  TSH   Collection Time: 07/12/23  2:41 PM  Result Value Ref Range   TSH 1.600 0.450 - 4.500 uIU/mL  HgB A1c   Collection Time: 07/12/23  2:41 PM  Result Value Ref Range   Hgb A1c MFr Bld 5.5 4.8 - 5.6 %   Est. average glucose Bld gHb Est-mCnc 111 mg/dL  HIV antibody (with reflex)   Collection Time: 07/12/23  2:41 PM  Result Value Ref Range   HIV Screen 4th Generation wRfx Non Reactive Non Reactive  Hepatitis C antibody   Collection Time: 07/12/23  2:41 PM  Result Value Ref Range   Hep C Virus Ab Non Reactive Non Reactive      Assessment & Plan:   Problem List Items Addressed This Visit       Musculoskeletal and Integument   Intrinsic atopic dermatitis - Primary   Chronic, currently unable to take medication dermatology orders due to no insurance.  Will restart in new year.  Refill on Triamcinolone sent and advised him if too costly could try OTC hydrocortisone for now until new insurance begins.        Follow up plan: Return if symptoms worsen or fail to improve.

## 2023-12-26 DIAGNOSIS — J301 Allergic rhinitis due to pollen: Secondary | ICD-10-CM | POA: Diagnosis not present

## 2023-12-30 DIAGNOSIS — J301 Allergic rhinitis due to pollen: Secondary | ICD-10-CM | POA: Diagnosis not present

## 2024-01-02 DIAGNOSIS — J301 Allergic rhinitis due to pollen: Secondary | ICD-10-CM | POA: Diagnosis not present

## 2024-01-04 NOTE — Patient Instructions (Signed)
 Asthma, Adult  Asthma is a condition that causes swelling and narrowing of the airways. These are the passages that lead from the nose and mouth down into the lungs. When asthma symptoms get worse it is called an asthma attack or flare. This can make it hard to breathe. Asthma flares can range from minor to life-threatening. There is no cure for asthma, but medicines and lifestyle changes can help to control it. What are the causes? It is not known exactly what causes asthma, but certain things can cause asthma symptoms to get worse (triggers). What can trigger an asthma attack? Cigarette smoke. Mold. Dust. Your pet's skin flakes (dander). Cockroaches. Pollen. Air pollution (like household cleaners, wood smoke, smog, or Therapist, occupational). What are the signs or symptoms? Trouble breathing (shortness of breath). Coughing. Making high-pitched whistling sounds when you breathe, most often when you breathe out (wheezing). Chest tightness. Tiredness with little activity. Poor exercise tolerance. How is this treated? Controller medicines that help prevent asthma symptoms. Fast-acting reliever or rescue medicines. These give short-term relief of asthma symptoms. Allergy medicines if your attacks are brought on by allergens. Medicines to help control the body's defense (immune) system. Staying away from the things that cause asthma attacks. Follow these instructions at home: Avoiding triggers in your home Do not allow anyone to smoke in your home. Limit use of fireplaces and wood stoves. Get rid of pests (such as roaches and mice) and their droppings. Keep your home clean. Clean your floors. Dust regularly. Use cleaning products that do not smell. Wash bed sheets and blankets every week in hot water. Dry them in a dryer. Have someone vacuum when you are not home. Change your heating and air conditioning filters often. Use blankets that are made of polyester or cotton. General  instructions Take over-the-counter and prescription medicines only as told by your doctor. Do not smoke or use any products that contain nicotine or tobacco. If you need help quitting, ask your doctor. Stay away from secondhand smoke. Avoid doing things outdoors when allergen counts are high and when air quality is low. Warm up before you exercise. Take time to cool down after exercise. Use a peak flow meter as told by your doctor. A peak flow meter is a tool that measures how well your lungs are working. Keep track of the peak flow meter's readings. Write them down. Follow your asthma action plan. This is a written plan for taking care of your asthma and treating your attacks. Make sure you get all the shots (vaccines) that your doctor recommends. Ask your doctor about a flu shot and a pneumonia shot. Keep all follow-up visits. Contact a doctor if: You have wheezing, shortness of breath, or a cough even while taking medicine to prevent attacks. The mucus you cough up (sputum) is thicker than usual. The mucus you cough up changes from clear or white to yellow, green, gray, or is bloody. You have problems from the medicine you are taking, such as: A rash. Itching. Swelling. Trouble breathing. You need reliever medicines more than 2-3 times a week. Your peak flow reading is still at 50-79% of your personal best after following the action plan for 1 hour. You have a fever. Get help right away if: You seem to be worse and are not responding to medicine during an asthma attack. You are short of breath even at rest. You get short of breath when doing very little activity. You have trouble eating, drinking, or talking. You have chest  pain or tightness. You have a fast heartbeat. Your lips or fingernails start to turn blue. You are light-headed or dizzy, or you faint. Your peak flow is less than 50% of your personal best. You feel too tired to breathe normally. These symptoms may be an  emergency. Get help right away. Call 911. Do not wait to see if the symptoms will go away. Do not drive yourself to the hospital. Summary Asthma is a long-term (chronic) condition in which the airways get tight and narrow. An asthma attack can make it hard to breathe. Asthma cannot be cured, but medicines and lifestyle changes can help control it. Make sure you understand how to avoid triggers and how and when to use your medicines. Avoid things that can cause allergy symptoms (allergens). These include animal skin flakes (dander) and pollen from trees or grass. Avoid things that pollute the air. These may include household cleaners, wood smoke, smog, or chemical odors. This information is not intended to replace advice given to you by your health care provider. Make sure you discuss any questions you have with your health care provider. Document Revised: 09/18/2021 Document Reviewed: 09/18/2021 Elsevier Patient Education  2024 [employer].

## 2024-01-09 DIAGNOSIS — J301 Allergic rhinitis due to pollen: Secondary | ICD-10-CM | POA: Diagnosis not present

## 2024-01-10 ENCOUNTER — Ambulatory Visit: Payer: BC Managed Care – PPO | Admitting: Nurse Practitioner

## 2024-01-10 ENCOUNTER — Encounter: Payer: Self-pay | Admitting: Nurse Practitioner

## 2024-01-10 DIAGNOSIS — J4520 Mild intermittent asthma, uncomplicated: Secondary | ICD-10-CM

## 2024-01-10 MED ORDER — AIRSUPRA 90-80 MCG/ACT IN AERO
2.0000 | INHALATION_SPRAY | Freq: Four times a day (QID) | RESPIRATORY_TRACT | 5 refills | Status: AC | PRN
Start: 2024-01-10 — End: ?

## 2024-01-10 MED ORDER — FLUTICASONE-SALMETEROL 100-50 MCG/ACT IN AEPB
1.0000 | INHALATION_SPRAY | Freq: Two times a day (BID) | RESPIRATORY_TRACT | 5 refills | Status: AC
Start: 2024-01-10 — End: ?

## 2024-01-10 NOTE — Assessment & Plan Note (Signed)
Chronic, stable.  Continue current medication regimen and adjust as needed.  Currently stable on inhaler regimen.

## 2024-01-10 NOTE — Progress Notes (Signed)
BP (!) 102/59   Pulse 64   Temp 98.1 F (36.7 C) (Oral)   Ht 6\' 1"  (1.854 m)   Wt 261 lb (118.4 kg)   SpO2 98%   BMI 34.43 kg/m    Subjective:    Patient ID: Curtis Meyer, male    DOB: December 27, 2000, 23 y.o.   MRN: [id number]  HPI: Curtis Meyer is a 23 y.o. male  Chief Complaint  Patient presents with   Asthma   ASTHMA Currently uses Advair and Airsupra. Diagnosed as child.  Regimen works well for him. Asthma status: controlled Satisfied with current treatment?: yes Albuterol/rescue inhaler frequency: when he exercises only Dyspnea frequency: none Wheezing frequency: only with exercise, uses rescue before Cough frequency: no Nocturnal symptom frequency: no Limitation of activity: no Current upper respiratory symptoms: no Triggers: cold weather Home peak flows:none Last Spirometry: none Failed/intolerant to following asthma meds: none Asthma meds in past: none Aerochamber/spacer use: no Visits to ER or Urgent Care in past year: no Pneumovax: Up to Date Influenza: Up to Date  Relevant past medical, surgical, family and social history reviewed and updated as indicated. Interim medical history since our last visit reviewed. Allergies and medications reviewed and updated.  Review of Systems  Constitutional:  Negative for activity change, diaphoresis, fatigue and fever.  Respiratory:  Negative for cough, chest tightness, shortness of breath and wheezing.   Cardiovascular:  Negative for chest pain, palpitations and leg swelling.  Gastrointestinal: Negative.   Neurological: Negative.   Psychiatric/Behavioral: Negative.      Per HPI unless specifically indicated above     Objective:    BP (!) 102/59   Pulse 64   Temp 98.1 F (36.7 C) (Oral)   Ht 6\' 1"  (1.854 m)   Wt 261 lb (118.4 kg)   SpO2 98%   BMI 34.43 kg/m   Wt Readings from Last 3 Encounters:  01/10/24 261 lb (118.4 kg)  12/13/23 254 lb 3.2 oz (115.3 kg)  07/12/23 261 lb (118.4 kg)     Physical Exam Vitals and nursing note reviewed.  Constitutional:      General: He is awake. He is not in acute distress.    Appearance: He is well-developed and well-groomed. He is obese. He is not ill-appearing or toxic-appearing.  HENT:     Head: Normocephalic.     Right Ear: Hearing and external ear normal.     Left Ear: Hearing and external ear normal.  Eyes:     General: Lids are normal.     Extraocular Movements: Extraocular movements intact.     Conjunctiva/sclera: Conjunctivae normal.  Neck:     Thyroid: No thyromegaly.     Vascular: No carotid bruit.  Cardiovascular:     Rate and Rhythm: Normal rate and regular rhythm.     Heart sounds: Normal heart sounds. No murmur heard.    No gallop.  Pulmonary:     Effort: No accessory muscle usage or respiratory distress.     Breath sounds: Normal breath sounds.  Abdominal:     General: Bowel sounds are normal. There is no distension.     Palpations: Abdomen is soft.     Tenderness: There is no abdominal tenderness.  Musculoskeletal:     Cervical back: Full passive range of motion without pain.     Right lower leg: No edema.     Left lower leg: No edema.  Lymphadenopathy:     Cervical: No cervical adenopathy.  Skin:  General: Skin is warm.     Capillary Refill: Capillary refill takes less than 2 seconds.  Neurological:     Mental Status: He is alert and oriented to person, place, and time.     Deep Tendon Reflexes: Reflexes are normal and symmetric.     Reflex Scores:      Brachioradialis reflexes are 2+ on the right side and 2+ on the left side.      Patellar reflexes are 2+ on the right side and 2+ on the left side. Psychiatric:        Attention and Perception: Attention normal.        Mood and Affect: Mood normal.        Speech: Speech normal.        Behavior: Behavior normal. Behavior is cooperative.        Thought Content: Thought content normal.     Results for orders placed or performed in visit on 07/12/23   Urinalysis, Routine w reflex microscopic   Collection Time: 07/12/23  2:33 PM  Result Value Ref Range   Specific Gravity, UA 1.020 1.005 - 1.030   pH, UA 6.5 5.0 - 7.5   Color, UA Yellow Yellow   Appearance Ur Clear Clear   Leukocytes,UA Negative Negative   Protein,UA Negative Negative/Trace   Glucose, UA Negative Negative   Ketones, UA Negative Negative   RBC, UA Negative Negative   Bilirubin, UA Negative Negative   Urobilinogen, Ur 0.2 0.2 - 1.0 mg/dL   Nitrite, UA Negative Negative   Microscopic Examination Comment   CBC w/Diff   Collection Time: 07/12/23  2:41 PM  Result Value Ref Range   WBC 7.7 3.4 - 10.8 x10E3/uL   RBC 5.25 4.14 - 5.80 x10E6/uL   Hemoglobin 14.9 13.0 - 17.7 g/dL   Hematocrit 27.2 53.6 - 51.0 %   MCV 87 79 - 97 fL   MCH 28.4 26.6 - 33.0 pg   MCHC 32.6 31.5 - 35.7 g/dL   RDW 64.4 03.4 - 74.2 %   Platelets 203 150 - 450 x10E3/uL   Neutrophils 38 Not Estab. %   Lymphs 46 Not Estab. %   Monocytes 6 Not Estab. %   Eos 9 Not Estab. %   Basos 1 Not Estab. %   Neutrophils Absolute 2.9 1.4 - 7.0 x10E3/uL   Lymphocytes Absolute 3.5 (H) 0.7 - 3.1 x10E3/uL   Monocytes Absolute 0.5 0.1 - 0.9 x10E3/uL   EOS (ABSOLUTE) 0.7 (H) 0.0 - 0.4 x10E3/uL   Basophils Absolute 0.1 0.0 - 0.2 x10E3/uL   Immature Granulocytes 0 Not Estab. %   Immature Grans (Abs) 0.0 0.0 - 0.1 x10E3/uL  Comp Met (CMET)   Collection Time: 07/12/23  2:41 PM  Result Value Ref Range   Glucose 59 (L) 70 - 99 mg/dL   BUN 8 6 - 20 mg/dL   Creatinine, Ser 5.95 0.76 - 1.27 mg/dL   eGFR 96 >63 OV/FIE/3.32   BUN/Creatinine Ratio 7 (L) 9 - 20   Sodium 140 134 - 144 mmol/L   Potassium 4.3 3.5 - 5.2 mmol/L   Chloride 101 96 - 106 mmol/L   CO2 25 20 - 29 mmol/L   Calcium 10.2 8.7 - 10.2 mg/dL   Total Protein 7.9 6.0 - 8.5 g/dL   Albumin 5.0 4.3 - 5.2 g/dL   Globulin, Total 2.9 1.5 - 4.5 g/dL   Bilirubin Total 0.6 0.0 - 1.2 mg/dL   Alkaline Phosphatase 120 44 - 121 IU/L  AST 46 (H) 0 - 40 IU/L    ALT 67 (H) 0 - 44 IU/L  Lipid Profile   Collection Time: 07/12/23  2:41 PM  Result Value Ref Range   Cholesterol, Total 211 (H) 100 - 199 mg/dL   Triglycerides 086 (H) 0 - 149 mg/dL   HDL 54 >57 mg/dL   VLDL Cholesterol Cal 56 (H) 5 - 40 mg/dL   LDL Chol Calc (NIH) 846 (H) 0 - 99 mg/dL   Chol/HDL Ratio 3.9 0.0 - 5.0 ratio  TSH   Collection Time: 07/12/23  2:41 PM  Result Value Ref Range   TSH 1.600 0.450 - 4.500 uIU/mL  HgB A1c   Collection Time: 07/12/23  2:41 PM  Result Value Ref Range   Hgb A1c MFr Bld 5.5 4.8 - 5.6 %   Est. average glucose Bld gHb Est-mCnc 111 mg/dL  HIV antibody (with reflex)   Collection Time: 07/12/23  2:41 PM  Result Value Ref Range   HIV Screen 4th Generation wRfx Non Reactive Non Reactive  Hepatitis C antibody   Collection Time: 07/12/23  2:41 PM  Result Value Ref Range   Hep C Virus Ab Non Reactive Non Reactive      Assessment & Plan:   Problem List Items Addressed This Visit       Respiratory   Mild intermittent asthma without complication   Chronic, stable.  Continue current medication regimen and adjust as needed.  Currently stable on inhaler regimen.      Relevant Medications   Albuterol-Budesonide (AIRSUPRA) 90-80 MCG/ACT AERO   fluticasone-salmeterol (ADVAIR) 100-50 MCG/ACT AEPB     Follow up plan: Return in about 6 months (around 07/13/2024) for Annual Physical -- after July 19th.

## 2024-01-13 DIAGNOSIS — J301 Allergic rhinitis due to pollen: Secondary | ICD-10-CM | POA: Diagnosis not present

## 2024-01-16 DIAGNOSIS — J301 Allergic rhinitis due to pollen: Secondary | ICD-10-CM | POA: Diagnosis not present

## 2024-01-20 DIAGNOSIS — J301 Allergic rhinitis due to pollen: Secondary | ICD-10-CM | POA: Diagnosis not present

## 2024-01-23 DIAGNOSIS — J301 Allergic rhinitis due to pollen: Secondary | ICD-10-CM | POA: Diagnosis not present

## 2024-01-27 DIAGNOSIS — J301 Allergic rhinitis due to pollen: Secondary | ICD-10-CM | POA: Diagnosis not present

## 2024-02-03 DIAGNOSIS — J301 Allergic rhinitis due to pollen: Secondary | ICD-10-CM | POA: Diagnosis not present

## 2024-02-06 DIAGNOSIS — J301 Allergic rhinitis due to pollen: Secondary | ICD-10-CM | POA: Diagnosis not present

## 2024-02-10 DIAGNOSIS — J301 Allergic rhinitis due to pollen: Secondary | ICD-10-CM | POA: Diagnosis not present

## 2024-02-11 DIAGNOSIS — J301 Allergic rhinitis due to pollen: Secondary | ICD-10-CM | POA: Diagnosis not present

## 2024-02-13 DIAGNOSIS — J301 Allergic rhinitis due to pollen: Secondary | ICD-10-CM | POA: Diagnosis not present

## 2024-02-17 DIAGNOSIS — J301 Allergic rhinitis due to pollen: Secondary | ICD-10-CM | POA: Diagnosis not present

## 2024-02-20 DIAGNOSIS — J301 Allergic rhinitis due to pollen: Secondary | ICD-10-CM | POA: Diagnosis not present

## 2024-02-24 DIAGNOSIS — J301 Allergic rhinitis due to pollen: Secondary | ICD-10-CM | POA: Diagnosis not present

## 2024-02-27 DIAGNOSIS — J301 Allergic rhinitis due to pollen: Secondary | ICD-10-CM | POA: Diagnosis not present

## 2024-03-09 DIAGNOSIS — L2089 Other atopic dermatitis: Secondary | ICD-10-CM | POA: Diagnosis not present

## 2024-03-09 DIAGNOSIS — J301 Allergic rhinitis due to pollen: Secondary | ICD-10-CM | POA: Diagnosis not present

## 2024-03-12 DIAGNOSIS — J301 Allergic rhinitis due to pollen: Secondary | ICD-10-CM | POA: Diagnosis not present

## 2024-03-19 DIAGNOSIS — J301 Allergic rhinitis due to pollen: Secondary | ICD-10-CM | POA: Diagnosis not present

## 2024-03-23 DIAGNOSIS — J301 Allergic rhinitis due to pollen: Secondary | ICD-10-CM | POA: Diagnosis not present

## 2024-03-26 DIAGNOSIS — J301 Allergic rhinitis due to pollen: Secondary | ICD-10-CM | POA: Diagnosis not present

## 2024-03-30 DIAGNOSIS — J301 Allergic rhinitis due to pollen: Secondary | ICD-10-CM | POA: Diagnosis not present

## 2024-04-02 DIAGNOSIS — J301 Allergic rhinitis due to pollen: Secondary | ICD-10-CM | POA: Diagnosis not present

## 2024-04-06 DIAGNOSIS — J301 Allergic rhinitis due to pollen: Secondary | ICD-10-CM | POA: Diagnosis not present

## 2024-04-13 DIAGNOSIS — J301 Allergic rhinitis due to pollen: Secondary | ICD-10-CM | POA: Diagnosis not present

## 2024-04-16 DIAGNOSIS — J301 Allergic rhinitis due to pollen: Secondary | ICD-10-CM | POA: Diagnosis not present

## 2024-04-23 DIAGNOSIS — J301 Allergic rhinitis due to pollen: Secondary | ICD-10-CM | POA: Diagnosis not present

## 2024-04-30 DIAGNOSIS — J301 Allergic rhinitis due to pollen: Secondary | ICD-10-CM | POA: Diagnosis not present

## 2024-05-07 DIAGNOSIS — J301 Allergic rhinitis due to pollen: Secondary | ICD-10-CM | POA: Diagnosis not present

## 2024-05-14 DIAGNOSIS — J301 Allergic rhinitis due to pollen: Secondary | ICD-10-CM | POA: Diagnosis not present

## 2024-05-28 DIAGNOSIS — J301 Allergic rhinitis due to pollen: Secondary | ICD-10-CM | POA: Diagnosis not present

## 2024-06-04 DIAGNOSIS — J301 Allergic rhinitis due to pollen: Secondary | ICD-10-CM | POA: Diagnosis not present

## 2024-06-24 DIAGNOSIS — J301 Allergic rhinitis due to pollen: Secondary | ICD-10-CM | POA: Diagnosis not present

## 2024-06-25 DIAGNOSIS — J301 Allergic rhinitis due to pollen: Secondary | ICD-10-CM | POA: Diagnosis not present

## 2024-07-09 DIAGNOSIS — J301 Allergic rhinitis due to pollen: Secondary | ICD-10-CM | POA: Diagnosis not present

## 2024-07-26 NOTE — Patient Instructions (Signed)

## 2024-07-30 DIAGNOSIS — J301 Allergic rhinitis due to pollen: Secondary | ICD-10-CM | POA: Diagnosis not present

## 2024-07-31 ENCOUNTER — Ambulatory Visit (INDEPENDENT_AMBULATORY_CARE_PROVIDER_SITE_OTHER): Payer: Self-pay | Admitting: Nurse Practitioner

## 2024-07-31 ENCOUNTER — Encounter: Payer: Self-pay | Admitting: Nurse Practitioner

## 2024-07-31 VITALS — BP 117/70 | HR 99 | Temp 98.6°F | Ht 73.1 in | Wt 263.6 lb

## 2024-07-31 DIAGNOSIS — M545 Low back pain, unspecified: Secondary | ICD-10-CM

## 2024-07-31 DIAGNOSIS — L2084 Intrinsic (allergic) eczema: Secondary | ICD-10-CM | POA: Diagnosis not present

## 2024-07-31 DIAGNOSIS — E6609 Other obesity due to excess calories: Secondary | ICD-10-CM

## 2024-07-31 DIAGNOSIS — J4520 Mild intermittent asthma, uncomplicated: Secondary | ICD-10-CM

## 2024-07-31 DIAGNOSIS — E66811 Obesity, class 1: Secondary | ICD-10-CM | POA: Insufficient documentation

## 2024-07-31 DIAGNOSIS — F419 Anxiety disorder, unspecified: Secondary | ICD-10-CM | POA: Diagnosis not present

## 2024-07-31 DIAGNOSIS — Z6834 Body mass index (BMI) 34.0-34.9, adult: Secondary | ICD-10-CM

## 2024-07-31 DIAGNOSIS — Z13220 Encounter for screening for lipoid disorders: Secondary | ICD-10-CM

## 2024-07-31 DIAGNOSIS — Z136 Encounter for screening for cardiovascular disorders: Secondary | ICD-10-CM | POA: Diagnosis not present

## 2024-07-31 DIAGNOSIS — Z0000 Encounter for general adult medical examination without abnormal findings: Secondary | ICD-10-CM | POA: Diagnosis not present

## 2024-07-31 DIAGNOSIS — G8929 Other chronic pain: Secondary | ICD-10-CM

## 2024-07-31 DIAGNOSIS — F32A Depression, unspecified: Secondary | ICD-10-CM | POA: Insufficient documentation

## 2024-07-31 MED ORDER — CYCLOBENZAPRINE HCL 10 MG PO TABS
10.0000 mg | ORAL_TABLET | Freq: Three times a day (TID) | ORAL | 0 refills | Status: AC | PRN
Start: 2024-07-31 — End: ?

## 2024-07-31 MED ORDER — MELOXICAM 15 MG PO TABS: 15.0000 mg | ORAL_TABLET | Freq: Every day | ORAL | 0 refills | Status: AC | PRN

## 2024-07-31 NOTE — Assessment & Plan Note (Signed)
 With acute flare, suspect more muscular in nature.  Will send in Flexeril  to take PRN and Meloxicam  PRN.  Recommend using OTC Icy/Hot lidocaine  patches + alternate heat and ice.  Rest back over weekend.  Work note provided.  No red flags on exam.

## 2024-07-31 NOTE — Progress Notes (Signed)
 BP 117/70   Pulse 99   Temp 98.6 F (37 C) (Oral)   Ht 6' 1.1 (1.857 m)   Wt 263 lb 9.6 oz (119.6 kg)   SpO2 98%   BMI 34.68 kg/m    Subjective:    Patient ID: Curtis Meyer, male    DOB: May 29, 2001, 23 y.o.   MRN: [id number]  HPI: Curtis Meyer is a 23 y.o. male presenting on 07/31/2024 for comprehensive medical examination. Current medical complaints include:none  He currently lives with: with parents Interim Problems from his last visit: no  Goes to dermatology for eczema, is looking into going on medications for this. Continues to use inhalers as needed for asthma.  RIGHT SIDE PAIN LOW BACK Started yesterday to right lower back.   Duration: days Mechanism of injury: unknown Location: Right and low back Onset: sudden Severity: 7/10 Quality: dull, aching, and throbbing Frequency: constant when sits down pain will shoot up Radiation: goes down right thigh a little bit Aggravating factors: lifting and movement Alleviating factors: Tried stretches Status: stable Treatments attempted: stretches  Relief with NSAIDs?: No NSAIDs Taken Nighttime pain:  no Paresthesias / decreased sensation:  no Bowel / bladder incontinence:  no Fevers:  no Dysuria / urinary frequency:  no     FALL RISK:    07/31/2024    4:05 PM 01/10/2024    4:05 PM 06/06/2023    2:07 PM 04/11/2022    2:10 PM  Fall Risk   Falls in the past year? 0 0 0 0  Number falls in past yr: 0 0 0 0  Injury with Fall? 0 0 0 0  Risk for fall due to : No Fall Risks No Fall Risks No Fall Risks No Fall Risks  Follow up Falls evaluation completed Falls evaluation completed  Falls evaluation completed      Data saved with a previous flowsheet row definition   Depression Screen    07/31/2024    4:06 PM 01/10/2024    4:06 PM 06/06/2023    2:07 PM 04/11/2022    2:10 PM  Depression screen PHQ 2/9  Decreased Interest 0 2 0 0  Down, Depressed, Hopeless 1 1 0 0  PHQ - 2 Score 1 3 0 0  Altered sleeping 2 1 3  0   Tired, decreased energy 0 0 2 0  Change in appetite 3 2 1  0  Feeling bad or failure about yourself  1 1 1  0  Trouble concentrating 0 1 0 0  Moving slowly or fidgety/restless 0 0 0 0  Suicidal thoughts 0 0 0 0  PHQ-9 Score 7 8 7  0  Difficult doing work/chores Somewhat difficult Somewhat difficult Somewhat difficult Not difficult at all      07/31/2024    4:07 PM 01/10/2024    4:06 PM 06/06/2023    2:07 PM 04/11/2022    2:10 PM  GAD 7 : Generalized Anxiety Score  Nervous, Anxious, on Edge 1 0 0 1  Control/stop worrying 1 0 0 0  Worry too much - different things 1 0 0 0  Trouble relaxing 0 0 0 0  Restless 0 0 0 0  Easily annoyed or irritable 0 0 0 0  Afraid - awful might happen 0 0 0 0  Total GAD 7 Score 3 0 0 1  Anxiety Difficulty Somewhat difficult Not difficult at all Not difficult at all Not difficult at all   Past Medical History:  Past Medical History:  Diagnosis  Date   Asthma    Eczema    Sleep-disordered breathing    Tonsillar and adenoid hypertrophy    SNORING    Surgical History:  Past Surgical History:  Procedure Laterality Date   TONSILLECTOMY AND ADENOIDECTOMY N/A 11/16/2015   Procedure: TONSILLECTOMY AND ADENOIDECTOMY;  Surgeon: Carolee Hunter, MD;  Location: [facility];  Service: ENT;  Laterality: N/A;  ADENOIDS CAUTERIZED NO TISSUE SENT    Medications:  Current Outpatient Medications on File Prior to Visit  Medication Sig   Albuterol -Budesonide (AIRSUPRA ) 90-80 MCG/ACT AERO Inhale 2 puffs into the lungs every 6 (six) hours as needed.   cetirizine  (ZYRTEC ) 10 MG tablet Take 1 tablet (10 mg total) by mouth daily.   fluticasone -salmeterol (ADVAIR) 100-50 MCG/ACT AEPB Inhale 1 puff into the lungs 2 (two) times daily.   triamcinolone  ointment (KENALOG ) 0.1 % Apply 1 Application topically 2 (two) times daily as needed.   No current facility-administered medications on file prior to visit.    Allergies:  No Known Allergies  Social History:   Social History   Socioeconomic History   Marital status: Single    Spouse name: Not on file   Number of children: Not on file   Years of education: Not on file   Highest education level: Not on file  Occupational History   Not on file  Tobacco Use   Smoking status: Never   Smokeless tobacco: Never  Vaping Use   Vaping status: Never Used  Substance and Sexual Activity   Alcohol use: Yes    Comment: on occasion   Drug use: Never   Sexual activity: Not Currently  Other Topics Concern   Not on file  Social History Narrative   Not on file   Social Drivers of Health   Financial Resource Strain: Not on file  Food Insecurity: Not on file  Transportation Needs: Not on file  Physical Activity: Not on file  Stress: Not on file  Social Connections: Not on file  Intimate Partner Violence: Not on file   Social History   Tobacco Use  Smoking Status Never  Smokeless Tobacco Never   Social History   Substance and Sexual Activity  Alcohol Use Yes   Comment: on occasion    Family History:  History reviewed. No pertinent family history.  Past medical history, surgical history, medications, allergies, family history and social history reviewed with patient today and changes made to appropriate areas of the chart.   ROS All other ROS negative except what is listed above and in the HPI.      Objective:    BP 117/70   Pulse 99   Temp 98.6 F (37 C) (Oral)   Ht 6' 1.1 (1.857 m)   Wt 263 lb 9.6 oz (119.6 kg)   SpO2 98%   BMI 34.68 kg/m   Wt Readings from Last 3 Encounters:  07/31/24 263 lb 9.6 oz (119.6 kg)  01/10/24 261 lb (118.4 kg)  12/13/23 254 lb 3.2 oz (115.3 kg)    Physical Exam Vitals and nursing note reviewed.  Constitutional:      General: He is awake. He is not in acute distress.    Appearance: He is well-developed and well-groomed. He is obese. He is not ill-appearing or toxic-appearing.  HENT:     Head: Normocephalic and atraumatic.     Right Ear:  Hearing, tympanic membrane, ear canal and external ear normal. No drainage.     Left Ear: Hearing, tympanic membrane, ear canal  and external ear normal. No drainage.     Nose: Nose normal.     Mouth/Throat:     Pharynx: Uvula midline.  Eyes:     General: Lids are normal.        Right eye: No discharge.        Left eye: No discharge.     Extraocular Movements: Extraocular movements intact.     Conjunctiva/sclera: Conjunctivae normal.     Pupils: Pupils are equal, round, and reactive to light.     Visual Fields: Right eye visual fields normal and left eye visual fields normal.  Neck:     Thyroid: No thyromegaly.     Vascular: No carotid bruit or JVD.     Trachea: Trachea normal.  Cardiovascular:     Rate and Rhythm: Normal rate and regular rhythm.     Heart sounds: Normal heart sounds, S1 normal and S2 normal. No murmur heard.    No gallop.  Pulmonary:     Effort: Pulmonary effort is normal. No accessory muscle usage or respiratory distress.     Breath sounds: Wheezing present. No decreased breath sounds or rales.     Comments: A few expiratory wheezes noted. Abdominal:     General: Bowel sounds are normal.     Palpations: Abdomen is soft. There is no hepatomegaly or splenomegaly.     Tenderness: There is no abdominal tenderness.  Musculoskeletal:     Cervical back: Normal range of motion and neck supple.     Lumbar back: Tenderness present. No swelling, spasms or bony tenderness. Decreased range of motion (flexion and extension). Negative right straight leg raise test and negative left straight leg raise test.     Right lower leg: No edema.     Left lower leg: No edema.     Comments: Left lower back tight on palpation.  No rashes.  Lymphadenopathy:     Head:     Right side of head: No submental, submandibular, tonsillar, preauricular or posterior auricular adenopathy.     Left side of head: No submental, submandibular, tonsillar, preauricular or posterior auricular adenopathy.      Cervical: No cervical adenopathy.  Skin:    General: Skin is warm and dry.     Capillary Refill: Capillary refill takes less than 2 seconds.     Comments: Eczema to bilateral arms and lower legs.  Neurological:     Mental Status: He is alert and oriented to person, place, and time.     Gait: Gait is intact.     Deep Tendon Reflexes: Reflexes are normal and symmetric.     Reflex Scores:      Brachioradialis reflexes are 2+ on the right side and 2+ on the left side.      Patellar reflexes are 2+ on the right side and 2+ on the left side. Psychiatric:        Attention and Perception: Attention normal.        Mood and Affect: Mood normal.        Speech: Speech normal.        Behavior: Behavior normal. Behavior is cooperative.        Thought Content: Thought content normal.        Cognition and Memory: Cognition normal.      Results for orders placed or performed in visit on 07/12/23  Urinalysis, Routine w reflex microscopic   Collection Time: 07/12/23  2:33 PM  Result Value Ref Range   Specific Gravity,  UA 1.020 1.005 - 1.030   pH, UA 6.5 5.0 - 7.5   Color, UA Yellow Yellow   Appearance Ur Clear Clear   Leukocytes,UA Negative Negative   Protein,UA Negative Negative/Trace   Glucose, UA Negative Negative   Ketones, UA Negative Negative   RBC, UA Negative Negative   Bilirubin, UA Negative Negative   Urobilinogen, Ur 0.2 0.2 - 1.0 mg/dL   Nitrite, UA Negative Negative   Microscopic Examination Comment   CBC w/Diff   Collection Time: 07/12/23  2:41 PM  Result Value Ref Range   WBC 7.7 3.4 - 10.8 x10E3/uL   RBC 5.25 4.14 - 5.80 x10E6/uL   Hemoglobin 14.9 13.0 - 17.7 g/dL   Hematocrit 54.2 62.4 - 51.0 %   MCV 87 79 - 97 fL   MCH 28.4 26.6 - 33.0 pg   MCHC 32.6 31.5 - 35.7 g/dL   RDW 85.8 88.3 - 84.5 %   Platelets 203 150 - 450 x10E3/uL   Neutrophils 38 Not Estab. %   Lymphs 46 Not Estab. %   Monocytes 6 Not Estab. %   Eos 9 Not Estab. %   Basos 1 Not Estab. %    Neutrophils Absolute 2.9 1.4 - 7.0 x10E3/uL   Lymphocytes Absolute 3.5 (H) 0.7 - 3.1 x10E3/uL   Monocytes Absolute 0.5 0.1 - 0.9 x10E3/uL   EOS (ABSOLUTE) 0.7 (H) 0.0 - 0.4 x10E3/uL   Basophils Absolute 0.1 0.0 - 0.2 x10E3/uL   Immature Granulocytes 0 Not Estab. %   Immature Grans (Abs) 0.0 0.0 - 0.1 x10E3/uL  Comp Met (CMET)   Collection Time: 07/12/23  2:41 PM  Result Value Ref Range   Glucose 59 (L) 70 - 99 mg/dL   BUN 8 6 - 20 mg/dL   Creatinine, Ser 8.88 0.76 - 1.27 mg/dL   eGFR 96 >40 fO/fpw/8.26   BUN/Creatinine Ratio 7 (L) 9 - 20   Sodium 140 134 - 144 mmol/L   Potassium 4.3 3.5 - 5.2 mmol/L   Chloride 101 96 - 106 mmol/L   CO2 25 20 - 29 mmol/L   Calcium 10.2 8.7 - 10.2 mg/dL   Total Protein 7.9 6.0 - 8.5 g/dL   Albumin 5.0 4.3 - 5.2 g/dL   Globulin, Total 2.9 1.5 - 4.5 g/dL   Bilirubin Total 0.6 0.0 - 1.2 mg/dL   Alkaline Phosphatase 120 44 - 121 IU/L   AST 46 (H) 0 - 40 IU/L   ALT 67 (H) 0 - 44 IU/L  Lipid Profile   Collection Time: 07/12/23  2:41 PM  Result Value Ref Range   Cholesterol, Total 211 (H) 100 - 199 mg/dL   Triglycerides 665 (H) 0 - 149 mg/dL   HDL 54 >60 mg/dL   VLDL Cholesterol Cal 56 (H) 5 - 40 mg/dL   LDL Chol Calc (NIH) 898 (H) 0 - 99 mg/dL   Chol/HDL Ratio 3.9 0.0 - 5.0 ratio  TSH   Collection Time: 07/12/23  2:41 PM  Result Value Ref Range   TSH 1.600 0.450 - 4.500 uIU/mL  HgB A1c   Collection Time: 07/12/23  2:41 PM  Result Value Ref Range   Hgb A1c MFr Bld 5.5 4.8 - 5.6 %   Est. average glucose Bld gHb Est-mCnc 111 mg/dL  HIV antibody (with reflex)   Collection Time: 07/12/23  2:41 PM  Result Value Ref Range   HIV Screen 4th Generation wRfx Non Reactive Non Reactive  Hepatitis C antibody   Collection Time: 07/12/23  2:41 PM  Result Value Ref Range   Hep C Virus Ab Non Reactive Non Reactive      Assessment & Plan:   Problem List Items Addressed This Visit       Respiratory   Mild intermittent asthma without complication -  Primary   Chronic, stable.  Continue current medication regimen and adjust as needed.  Currently stable on inhaler regimen.      Relevant Orders   CBC with Differential/Platelet     Musculoskeletal and Integument   Intrinsic atopic dermatitis   Chronic, ongoing. Followed by dermatology.  Continue medications as ordered by them.  Is considering trying Dupixent.  Labs today.      Relevant Orders   Comprehensive metabolic panel with GFR   TSH     Other   Class 1 obesity due to excess calories without serious comorbidity with body mass index (BMI) of 34.0 to 34.9 in adult   BMI 34.68.  Requested referral to weight management, to help with weight and learning nutrition. Referral placed. Recommended eating smaller high protein, low fat meals more frequently and exercising 30 mins a day 5 times a week with a goal of 10-15lb weight loss in the next 3 months. Patient voiced their understanding and motivation to adhere to these recommendations.       Relevant Orders   Amb Ref to Medical Weight Management   Chronic low back pain   With acute flare, suspect more muscular in nature.  Will send in Flexeril  to take PRN and Meloxicam  PRN.  Recommend using OTC Icy/Hot lidocaine  patches + alternate heat and ice.  Rest back over weekend.  Work note provided.  No red flags on exam.      Relevant Medications   cyclobenzaprine  (FLEXERIL ) 10 MG tablet   meloxicam  (MOBIC ) 15 MG tablet   Anxiety and depression   Chronic, he would like referral to therapy.  Referral placed.  Denies SI/HI.  No current medications.      Relevant Orders   Ambulatory referral to Psychology   Other Visit Diagnoses       Encounter for lipid screening for cardiovascular disease       Lipid panel today.   Relevant Orders   Lipid Panel w/o Chol/HDL Ratio     Encounter for annual physical exam       Annual physical today with health maintenance reviewed.       LABORATORY TESTING:  Health maintenance labs ordered today  as discussed above.   IMMUNIZATIONS:   - Tdap: Tetanus vaccination status reviewed: last tetanus booster within 10 years. - Influenza: Refused - Pneumovax: Not applicable - Prevnar: Refused - Zostavax vaccine: Not applicable  SCREENING: - Colonoscopy: Not applicable  Discussed with patient purpose of the colonoscopy is to detect colon cancer at curable precancerous or early stages   - AAA Screening: Not applicable  -Hearing Test: Not applicable  -Spirometry: Not applicable   PATIENT COUNSELING:    Sexuality: Discussed sexually transmitted diseases, partner selection, use of condoms, avoidance of unintended pregnancy  and contraceptive alternatives.   Advised to avoid cigarette smoking.  I discussed with the patient that most people either abstain from alcohol or drink within safe limits (<=14/week and <=4 drinks/occasion for males, <=7/weeks and <= 3 drinks/occasion for females) and that the risk for alcohol disorders and other health effects rises proportionally with the number of drinks per week and how often a drinker exceeds daily limits.  Discussed cessation/primary prevention of drug use and  availability of treatment for abuse.   Diet: Encouraged to adjust caloric intake to maintain  or achieve ideal body weight, to reduce intake of dietary saturated fat and total fat, to limit sodium intake by avoiding high sodium foods and not adding table salt, and to maintain adequate dietary potassium and calcium preferably from fresh fruits, vegetables, and low-fat dairy products.    Stressed the importance of regular exercise  Injury prevention: Discussed safety belts, safety helmets, smoke detector, smoking near bedding or upholstery.   Dental health: Discussed importance of regular tooth brushing, flossing, and dental visits.   Follow up plan: NEXT PREVENTATIVE PHYSICAL DUE IN 1 YEAR. Return in about 1 year (around 07/31/2025) for Annual Physical.

## 2024-07-31 NOTE — Assessment & Plan Note (Signed)
 BMI 34.68.  Requested referral to weight management, to help with weight and learning nutrition. Referral placed. Recommended eating smaller high protein, low fat meals more frequently and exercising 30 mins a day 5 times a week with a goal of 10-15lb weight loss in the next 3 months. Patient voiced their understanding and motivation to adhere to these recommendations.

## 2024-07-31 NOTE — Assessment & Plan Note (Signed)
 Chronic, he would like referral to therapy.  Referral placed.  Denies SI/HI.  No current medications.

## 2024-07-31 NOTE — Assessment & Plan Note (Signed)
 Chronic, ongoing. Followed by dermatology.  Continue medications as ordered by them.  Is considering trying Dupixent.  Labs today.

## 2024-07-31 NOTE — Assessment & Plan Note (Signed)
 Chronic, stable.  Continue current medication regimen and adjust as needed.  Currently stable on inhaler regimen.

## 2024-08-01 ENCOUNTER — Ambulatory Visit: Payer: Self-pay | Admitting: Nurse Practitioner

## 2024-08-01 LAB — CBC WITH DIFFERENTIAL/PLATELET
Basophils Absolute: 0.1 x10E3/uL (ref 0.0–0.2)
Basos: 2 %
EOS (ABSOLUTE): 1 x10E3/uL — ABNORMAL HIGH (ref 0.0–0.4)
Eos: 11 %
Hematocrit: 44.7 % (ref 37.5–51.0)
Hemoglobin: 14.7 g/dL (ref 13.0–17.7)
Immature Grans (Abs): 0.1 x10E3/uL (ref 0.0–0.1)
Immature Granulocytes: 1 %
Lymphocytes Absolute: 2.5 x10E3/uL (ref 0.7–3.1)
Lymphs: 28 %
MCH: 27.6 pg (ref 26.6–33.0)
MCHC: 32.9 g/dL (ref 31.5–35.7)
MCV: 84 fL (ref 79–97)
Monocytes Absolute: 0.7 x10E3/uL (ref 0.1–0.9)
Monocytes: 8 %
Neutrophils Absolute: 4.7 x10E3/uL (ref 1.4–7.0)
Neutrophils: 50 %
Platelets: 241 x10E3/uL (ref 150–450)
RBC: 5.32 x10E6/uL (ref 4.14–5.80)
RDW: 14.1 % (ref 11.6–15.4)
WBC: 9.1 x10E3/uL (ref 3.4–10.8)

## 2024-08-01 LAB — COMPREHENSIVE METABOLIC PANEL WITH GFR
ALT: 52 IU/L — ABNORMAL HIGH (ref 0–44)
AST: 31 IU/L (ref 0–40)
Albumin: 4.4 g/dL (ref 4.3–5.2)
Alkaline Phosphatase: 102 IU/L (ref 44–121)
BUN/Creatinine Ratio: 8 — ABNORMAL LOW (ref 9–20)
BUN: 7 mg/dL (ref 6–20)
Bilirubin Total: 0.5 mg/dL (ref 0.0–1.2)
CO2: 19 mmol/L — ABNORMAL LOW (ref 20–29)
Calcium: 9.5 mg/dL (ref 8.7–10.2)
Chloride: 101 mmol/L (ref 96–106)
Creatinine, Ser: 0.88 mg/dL (ref 0.76–1.27)
Globulin, Total: 3.2 g/dL (ref 1.5–4.5)
Glucose: 124 mg/dL — ABNORMAL HIGH (ref 70–99)
Potassium: 4.1 mmol/L (ref 3.5–5.2)
Sodium: 138 mmol/L (ref 134–144)
Total Protein: 7.6 g/dL (ref 6.0–8.5)
eGFR: 124 mL/min/1.73 (ref >59)

## 2024-08-01 LAB — LIPID PANEL W/O CHOL/HDL RATIO
Cholesterol, Total: 187 mg/dL (ref 100–199)
HDL: 45 mg/dL (ref >39)
LDL Chol Calc (NIH): 75 mg/dL (ref 0–99)
Triglycerides: 421 mg/dL — ABNORMAL HIGH (ref 0–149)
VLDL Cholesterol Cal: 67 mg/dL — ABNORMAL HIGH (ref 5–40)

## 2024-08-01 LAB — TSH: TSH: 1.09 u[IU]/mL (ref 0.450–4.500)

## 2024-08-19 ENCOUNTER — Ambulatory Visit: Admitting: Pediatrics

## 2024-10-26 DIAGNOSIS — M5441 Lumbago with sciatica, right side: Secondary | ICD-10-CM | POA: Diagnosis not present

## 2024-10-26 DIAGNOSIS — M9903 Segmental and somatic dysfunction of lumbar region: Secondary | ICD-10-CM | POA: Diagnosis not present

## 2024-10-29 DIAGNOSIS — F4321 Adjustment disorder with depressed mood: Secondary | ICD-10-CM | POA: Diagnosis not present

## 2024-10-29 DIAGNOSIS — F4010 Social phobia, unspecified: Secondary | ICD-10-CM | POA: Diagnosis not present

## 2024-11-05 DIAGNOSIS — F4010 Social phobia, unspecified: Secondary | ICD-10-CM | POA: Diagnosis not present

## 2024-11-05 DIAGNOSIS — F4321 Adjustment disorder with depressed mood: Secondary | ICD-10-CM | POA: Diagnosis not present

## 2024-11-12 DIAGNOSIS — F4321 Adjustment disorder with depressed mood: Secondary | ICD-10-CM | POA: Diagnosis not present

## 2024-11-12 DIAGNOSIS — F4010 Social phobia, unspecified: Secondary | ICD-10-CM | POA: Diagnosis not present

## 2024-11-26 DIAGNOSIS — F4010 Social phobia, unspecified: Secondary | ICD-10-CM | POA: Diagnosis not present

## 2024-11-26 DIAGNOSIS — L2089 Other atopic dermatitis: Secondary | ICD-10-CM | POA: Diagnosis not present

## 2024-11-26 DIAGNOSIS — F4321 Adjustment disorder with depressed mood: Secondary | ICD-10-CM | POA: Diagnosis not present

## 2024-12-02 ENCOUNTER — Other Ambulatory Visit (HOSPITAL_COMMUNITY): Payer: Self-pay

## 2024-12-02 ENCOUNTER — Telehealth: Payer: Self-pay | Admitting: Pharmacy Technician

## 2024-12-02 NOTE — Telephone Encounter (Signed)
 Pharmacy Patient Advocate Encounter   Received notification from Onbase that prior authorization for Airsupra  90-80MCG/ACT aerosol is required/requested.   Insurance verification completed.   The patient is insured through [employer].   Per test claim: PA required; PA submitted to above mentioned insurance via Latent Key/confirmation #/EOC [id number] Status is pending

## 2024-12-07 NOTE — Telephone Encounter (Signed)
 Pharmacy Patient Advocate Encounter  Received notification from [employer] that Prior Authorization for Airsupra  90-80MCG/ACT aerosol has been DENIED.  Full denial letter will be uploaded to the media tab. See denial reason below.   PA #/Case ID/Reference #: [id number]

## 2024-12-09 ENCOUNTER — Telehealth: Payer: Self-pay | Admitting: Pharmacist

## 2024-12-09 NOTE — Telephone Encounter (Signed)
 In an effort to submit the strongest possible appeal, can you clarify whether there is a clinical reason the patient is unable to try the preferred medications? Additionally, dispensing records indicate the patient has not filled Airsupra , only albuterol --was Airsupra  provided as samples?  Please advise!  Thank you, Devere Pandy, PharmD Clinical Pharmacist  [facility]  Direct Dial: [phone]

## 2025-08-02 ENCOUNTER — Encounter: Admitting: Nurse Practitioner
